# Patient Record
Sex: Male | Born: 1975 | Race: White | Hispanic: No | Marital: Single | Smoking: Current every day smoker
Health system: Southern US, Community
[De-identification: ages and names within clinical notes are randomized; demographics above are authoritative.]

## PROBLEM LIST (undated history)

## (undated) DIAGNOSIS — Z789 Other specified health status: Secondary | ICD-10-CM

---

## 2019-12-23 ENCOUNTER — Encounter (HOSPITAL_COMMUNITY)
Admission: EM | Disposition: A | Discharge status: Home / Self Care | Payer: Self-pay | Source: Home / Self Care | Attending: Orthopedic Surgery

## 2019-12-23 ENCOUNTER — Inpatient Hospital Stay (HOSPITAL_COMMUNITY)
Admission: EM | Admit: 2019-12-23 | Discharge: 2019-12-26 | DRG: 501 | Disposition: A | Discharge status: Home / Self Care | Payer: Self-pay | Attending: Orthopedic Surgery | Admitting: Orthopedic Surgery

## 2019-12-23 ENCOUNTER — Emergency Department (HOSPITAL_COMMUNITY): Payer: Self-pay

## 2019-12-23 ENCOUNTER — Encounter (HOSPITAL_COMMUNITY): Payer: Self-pay

## 2019-12-23 ENCOUNTER — Other Ambulatory Visit: Payer: Self-pay

## 2019-12-23 DIAGNOSIS — S52572A Other intraarticular fracture of lower end of left radius, initial encounter for closed fracture: Secondary | ICD-10-CM | POA: Diagnosis present

## 2019-12-23 DIAGNOSIS — S40012A Contusion of left shoulder, initial encounter: Secondary | ICD-10-CM | POA: Diagnosis present

## 2019-12-23 DIAGNOSIS — S52501A Unspecified fracture of the lower end of right radius, initial encounter for closed fracture: Secondary | ICD-10-CM

## 2019-12-23 DIAGNOSIS — S52571A Other intraarticular fracture of lower end of right radius, initial encounter for closed fracture: Principal | ICD-10-CM | POA: Diagnosis present

## 2019-12-23 DIAGNOSIS — F17200 Nicotine dependence, unspecified, uncomplicated: Secondary | ICD-10-CM | POA: Diagnosis present

## 2019-12-23 DIAGNOSIS — S52592A Other fractures of lower end of left radius, initial encounter for closed fracture: Secondary | ICD-10-CM | POA: Diagnosis present

## 2019-12-23 DIAGNOSIS — Y929 Unspecified place or not applicable: Secondary | ICD-10-CM

## 2019-12-23 DIAGNOSIS — R52 Pain, unspecified: Secondary | ICD-10-CM

## 2019-12-23 DIAGNOSIS — W11XXXA Fall on and from ladder, initial encounter: Secondary | ICD-10-CM | POA: Diagnosis present

## 2019-12-23 DIAGNOSIS — Z20822 Contact with and (suspected) exposure to covid-19: Secondary | ICD-10-CM | POA: Diagnosis present

## 2019-12-23 DIAGNOSIS — S52502A Unspecified fracture of the lower end of left radius, initial encounter for closed fracture: Secondary | ICD-10-CM

## 2019-12-23 HISTORY — PX: ORIF WRIST FRACTURE: SHX2133

## 2019-12-23 HISTORY — DX: Other specified health status: Z78.9

## 2019-12-23 LAB — SARS CORONAVIRUS 2 BY RT PCR (HOSPITAL ORDER, PERFORMED IN ~~LOC~~ HOSPITAL LAB): SARS Coronavirus 2: NEGATIVE

## 2019-12-23 SURGERY — OPEN REDUCTION INTERNAL FIXATION (ORIF) WRIST FRACTURE
Anesthesia: General | Site: Wrist | Laterality: Bilateral

## 2019-12-23 MED ORDER — HYDROCODONE-ACETAMINOPHEN 5-325 MG PO TABS
1.0000 | ORAL_TABLET | Freq: Once | ORAL | Status: AC
  Administered 2019-12-23: 1 via ORAL
  Filled 2019-12-23: qty 1

## 2019-12-23 MED ORDER — ONDANSETRON HCL 4 MG/2ML IJ SOLN: 4.0000 mg | Freq: Four times a day (QID) | INTRAMUSCULAR | Status: DC | PRN

## 2019-12-23 MED ORDER — PROMETHAZINE HCL 12.5 MG RE SUPP: 12.5000 mg | Freq: Four times a day (QID) | RECTAL | Status: DC | PRN

## 2019-12-23 MED ORDER — SUCCINYLCHOLINE CHLORIDE 200 MG/10ML IV SOSY
PREFILLED_SYRINGE | INTRAVENOUS | Status: AC
  Filled 2019-12-23: qty 10

## 2019-12-23 MED ORDER — POTASSIUM CHLORIDE 2 MEQ/ML IV SOLN
INTRAVENOUS | Status: DC
  Filled 2019-12-23 (×2): qty 1000

## 2019-12-23 MED ORDER — ONDANSETRON HCL 4 MG PO TABS: 4.0000 mg | ORAL_TABLET | Freq: Four times a day (QID) | ORAL | Status: DC | PRN

## 2019-12-23 MED ORDER — DEXAMETHASONE SODIUM PHOSPHATE 10 MG/ML IJ SOLN
INTRAMUSCULAR | Status: AC
  Filled 2019-12-23: qty 1

## 2019-12-23 MED ORDER — ACETAMINOPHEN 325 MG PO TABS
325.0000 mg | ORAL_TABLET | Freq: Four times a day (QID) | ORAL | Status: DC | PRN
  Administered 2019-12-25: 650 mg via ORAL
  Filled 2019-12-23: qty 2

## 2019-12-23 MED ORDER — MIDAZOLAM HCL 5 MG/5ML IJ SOLN
INTRAMUSCULAR | Status: DC | PRN
  Administered 2019-12-23: 2 mg via INTRAVENOUS

## 2019-12-23 MED ORDER — CHLORHEXIDINE GLUCONATE 0.12 % MT SOLN: 15.0000 mL | Freq: Once | OROMUCOSAL | Status: AC

## 2019-12-23 MED ORDER — MIDAZOLAM HCL 2 MG/2ML IJ SOLN
INTRAMUSCULAR | Status: AC
  Filled 2019-12-23: qty 2

## 2019-12-23 MED ORDER — HYDROMORPHONE HCL 1 MG/ML IJ SOLN
0.2500 mg | INTRAMUSCULAR | Status: DC | PRN
  Administered 2019-12-23 (×2): 0.5 mg via INTRAVENOUS

## 2019-12-23 MED ORDER — HYDROMORPHONE HCL 1 MG/ML IJ SOLN: 1.0000 mg | INTRAMUSCULAR | Status: DC | PRN

## 2019-12-23 MED ORDER — CHLORHEXIDINE GLUCONATE 0.12 % MT SOLN
OROMUCOSAL | Status: AC
  Administered 2019-12-23: 15 mL via OROMUCOSAL
  Filled 2019-12-23: qty 15

## 2019-12-23 MED ORDER — 0.9 % SODIUM CHLORIDE (POUR BTL) OPTIME
TOPICAL | Status: DC | PRN
  Administered 2019-12-23: 1000 mL

## 2019-12-23 MED ORDER — LACTATED RINGERS IV SOLN: INTRAVENOUS | Status: DC | PRN

## 2019-12-23 MED ORDER — HYDROMORPHONE HCL 1 MG/ML IJ SOLN
INTRAMUSCULAR | Status: DC | PRN
  Administered 2019-12-23: .5 mg via INTRAVENOUS

## 2019-12-23 MED ORDER — HYDROMORPHONE HCL 1 MG/ML IJ SOLN
1.0000 mg | Freq: Once | INTRAMUSCULAR | Status: AC
  Administered 2019-12-23: 1 mg via INTRAVENOUS
  Filled 2019-12-23: qty 1

## 2019-12-23 MED ORDER — LIDOCAINE HCL (CARDIAC) PF 100 MG/5ML IV SOSY
PREFILLED_SYRINGE | INTRAVENOUS | Status: DC | PRN
  Administered 2019-12-23: 60 mg via INTRAVENOUS

## 2019-12-23 MED ORDER — ROCURONIUM BROMIDE 10 MG/ML (PF) SYRINGE
PREFILLED_SYRINGE | INTRAVENOUS | Status: AC
  Filled 2019-12-23: qty 10

## 2019-12-23 MED ORDER — LIDOCAINE 2% (20 MG/ML) 5 ML SYRINGE
INTRAMUSCULAR | Status: AC
  Filled 2019-12-23: qty 5

## 2019-12-23 MED ORDER — ONDANSETRON HCL 4 MG/2ML IJ SOLN
INTRAMUSCULAR | Status: AC
  Filled 2019-12-23: qty 2

## 2019-12-23 MED ORDER — SUGAMMADEX SODIUM 200 MG/2ML IV SOLN
INTRAVENOUS | Status: DC | PRN
  Administered 2019-12-23: 200 mg via INTRAVENOUS

## 2019-12-23 MED ORDER — ROCURONIUM BROMIDE 100 MG/10ML IV SOLN
INTRAVENOUS | Status: DC | PRN
  Administered 2019-12-23: 60 mg via INTRAVENOUS

## 2019-12-23 MED ORDER — FAMOTIDINE 20 MG PO TABS: 20.0000 mg | ORAL_TABLET | Freq: Two times a day (BID) | ORAL | Status: DC | PRN

## 2019-12-23 MED ORDER — OXYCODONE HCL 5 MG/5ML PO SOLN: 5.0000 mg | Freq: Once | ORAL | Status: DC | PRN

## 2019-12-23 MED ORDER — OXYCODONE HCL 5 MG PO TABS
10.0000 mg | ORAL_TABLET | ORAL | Status: DC | PRN
  Administered 2019-12-24 (×3): 10 mg via ORAL
  Administered 2019-12-25: 15 mg via ORAL
  Administered 2019-12-25 (×4): 10 mg via ORAL
  Administered 2019-12-26 (×2): 15 mg via ORAL
  Filled 2019-12-23 (×2): qty 3
  Filled 2019-12-23: qty 2
  Filled 2019-12-23: qty 3

## 2019-12-23 MED ORDER — CEFAZOLIN SODIUM-DEXTROSE 1-4 GM/50ML-% IV SOLN
1.0000 g | INTRAVENOUS | Status: AC
  Administered 2019-12-23: 1 g via INTRAVENOUS
  Filled 2019-12-23: qty 50

## 2019-12-23 MED ORDER — DEXAMETHASONE SODIUM PHOSPHATE 4 MG/ML IJ SOLN
INTRAMUSCULAR | Status: DC | PRN
  Administered 2019-12-23: 4 mg via INTRAVENOUS

## 2019-12-23 MED ORDER — DIPHENHYDRAMINE HCL 50 MG/ML IJ SOLN
INTRAMUSCULAR | Status: DC | PRN
  Administered 2019-12-23: 12.5 mg via INTRAVENOUS

## 2019-12-23 MED ORDER — HYDROMORPHONE HCL 1 MG/ML IJ SOLN
INTRAMUSCULAR | Status: AC
  Filled 2019-12-23: qty 1

## 2019-12-23 MED ORDER — OXYCODONE HCL 5 MG PO TABS
5.0000 mg | ORAL_TABLET | ORAL | Status: DC | PRN
  Filled 2019-12-23 (×6): qty 2

## 2019-12-23 MED ORDER — ACETAMINOPHEN 500 MG PO TABS
1000.0000 mg | ORAL_TABLET | Freq: Four times a day (QID) | ORAL | Status: AC
  Administered 2019-12-23 – 2019-12-24 (×3): 1000 mg via ORAL
  Filled 2019-12-23 (×4): qty 2

## 2019-12-23 MED ORDER — DOCUSATE SODIUM 100 MG PO CAPS
100.0000 mg | ORAL_CAPSULE | Freq: Two times a day (BID) | ORAL | Status: DC
  Administered 2019-12-24 – 2019-12-26 (×4): 100 mg via ORAL
  Filled 2019-12-23 (×5): qty 1

## 2019-12-23 MED ORDER — CEFAZOLIN SODIUM-DEXTROSE 2-4 GM/100ML-% IV SOLN
INTRAVENOUS | Status: AC
  Filled 2019-12-23: qty 100

## 2019-12-23 MED ORDER — BUPIVACAINE HCL (PF) 0.25 % IJ SOLN
INTRAMUSCULAR | Status: AC
  Filled 2019-12-23: qty 30

## 2019-12-23 MED ORDER — BUPIVACAINE HCL (PF) 0.25 % IJ SOLN
INTRAMUSCULAR | Status: DC | PRN
  Administered 2019-12-23: 8 mL

## 2019-12-23 MED ORDER — ONDANSETRON HCL 4 MG/2ML IJ SOLN
INTRAMUSCULAR | Status: DC | PRN
  Administered 2019-12-23: 4 mg via INTRAVENOUS

## 2019-12-23 MED ORDER — ASCORBIC ACID 500 MG PO TABS
1000.0000 mg | ORAL_TABLET | Freq: Every day | ORAL | Status: DC
  Administered 2019-12-24 – 2019-12-26 (×3): 1000 mg via ORAL
  Filled 2019-12-23 (×3): qty 2

## 2019-12-23 MED ORDER — SODIUM CHLORIDE 0.9 % IV BOLUS
500.0000 mL | Freq: Once | INTRAVENOUS | Status: AC
  Administered 2019-12-23: 500 mL via INTRAVENOUS

## 2019-12-23 MED ORDER — OXYCODONE HCL 5 MG PO TABS: 5.0000 mg | ORAL_TABLET | Freq: Once | ORAL | Status: DC | PRN

## 2019-12-23 MED ORDER — HYDROMORPHONE HCL 1 MG/ML IJ SOLN
INTRAMUSCULAR | Status: AC
  Filled 2019-12-23: qty 0.5

## 2019-12-23 MED ORDER — ONDANSETRON HCL 4 MG/2ML IJ SOLN
4.0000 mg | Freq: Once | INTRAMUSCULAR | Status: AC
  Administered 2019-12-23: 4 mg via INTRAVENOUS
  Filled 2019-12-23: qty 2

## 2019-12-23 MED ORDER — PROPOFOL 10 MG/ML IV BOLUS
INTRAVENOUS | Status: DC | PRN
  Administered 2019-12-23: 200 mg via INTRAVENOUS

## 2019-12-23 MED ORDER — LACTATED RINGERS IV SOLN: INTRAVENOUS | Status: DC

## 2019-12-23 MED ORDER — METHOCARBAMOL 1000 MG/10ML IJ SOLN
500.0000 mg | Freq: Four times a day (QID) | INTRAVENOUS | Status: DC | PRN
  Filled 2019-12-23: qty 5

## 2019-12-23 MED ORDER — ALPRAZOLAM 0.5 MG PO TABS: 0.5000 mg | ORAL_TABLET | Freq: Four times a day (QID) | ORAL | Status: DC | PRN

## 2019-12-23 MED ORDER — CHLORHEXIDINE GLUCONATE 4 % EX LIQD: 60.0000 mL | Freq: Once | CUTANEOUS | Status: DC

## 2019-12-23 MED ORDER — CEFAZOLIN SODIUM-DEXTROSE 2-4 GM/100ML-% IV SOLN: 2.0000 g | INTRAVENOUS | Status: DC

## 2019-12-23 MED ORDER — MORPHINE SULFATE (PF) 4 MG/ML IV SOLN
4.0000 mg | INTRAVENOUS | Status: DC | PRN
  Administered 2019-12-23: 4 mg via INTRAVENOUS
  Filled 2019-12-23: qty 1

## 2019-12-23 MED ORDER — CEFAZOLIN SODIUM-DEXTROSE 1-4 GM/50ML-% IV SOLN
1.0000 g | Freq: Three times a day (TID) | INTRAVENOUS | Status: DC
  Administered 2019-12-24 – 2019-12-26 (×7): 1 g via INTRAVENOUS
  Filled 2019-12-23 (×7): qty 50

## 2019-12-23 MED ORDER — FENTANYL CITRATE (PF) 100 MCG/2ML IJ SOLN
INTRAMUSCULAR | Status: DC | PRN
Start: 2019-12-23 — End: 2019-12-23
  Administered 2019-12-23: 50 ug via INTRAVENOUS
  Administered 2019-12-23: 100 ug via INTRAVENOUS
  Administered 2019-12-23 (×2): 50 ug via INTRAVENOUS

## 2019-12-23 MED ORDER — PROPOFOL 10 MG/ML IV BOLUS
INTRAVENOUS | Status: AC
  Filled 2019-12-23: qty 40

## 2019-12-23 MED ORDER — METHOCARBAMOL 500 MG PO TABS
500.0000 mg | ORAL_TABLET | Freq: Four times a day (QID) | ORAL | Status: DC | PRN
  Administered 2019-12-24 – 2019-12-26 (×5): 500 mg via ORAL
  Filled 2019-12-23 (×5): qty 1

## 2019-12-23 MED ORDER — FENTANYL CITRATE (PF) 250 MCG/5ML IJ SOLN
INTRAMUSCULAR | Status: AC
Start: 2019-12-23 — End: ?
  Filled 2019-12-23: qty 5

## 2019-12-23 MED ORDER — HYDROMORPHONE HCL 1 MG/ML IJ SOLN
0.5000 mg | INTRAMUSCULAR | Status: DC | PRN
  Administered 2019-12-24 – 2019-12-26 (×7): 1 mg via INTRAVENOUS
  Administered 2019-12-26: 0.5 mg via INTRAVENOUS
  Filled 2019-12-23: qty 0.5
  Filled 2019-12-23 (×7): qty 1

## 2019-12-23 SURGICAL SUPPLY — 72 items
BIT DRILL 2.2 SS TIBIAL (BIT) ×3 IMPLANT
BLADE CLIPPER SURG (BLADE) ×6 IMPLANT
BNDG ELASTIC 3X5.8 VLCR STR LF (GAUZE/BANDAGES/DRESSINGS) ×6 IMPLANT
BNDG ELASTIC 4X5.8 VLCR STR LF (GAUZE/BANDAGES/DRESSINGS) ×6 IMPLANT
BNDG ESMARK 4X9 LF (GAUZE/BANDAGES/DRESSINGS) ×3 IMPLANT
BNDG GAUZE ELAST 4 BULKY (GAUZE/BANDAGES/DRESSINGS) ×6 IMPLANT
CANISTER SUCT 3000ML PPV (MISCELLANEOUS) ×3 IMPLANT
CORD BIPOLAR FORCEPS 12FT (ELECTRODE) ×3 IMPLANT
COVER SURGICAL LIGHT HANDLE (MISCELLANEOUS) ×3 IMPLANT
COVER WAND RF STERILE (DRAPES) ×6 IMPLANT
CUFF TOURN SGL QUICK 18X4 (TOURNIQUET CUFF) ×6 IMPLANT
CUFF TOURN SGL QUICK 24 (TOURNIQUET CUFF)
CUFF TRNQT CYL 24X4X16.5-23 (TOURNIQUET CUFF) IMPLANT
DRAIN TLS ROUND 10FR (DRAIN) IMPLANT
DRAPE EXTREMITY T 121X128X90 (DISPOSABLE) ×3 IMPLANT
DRAPE OEC MINIVIEW 54X84 (DRAPES) ×3 IMPLANT
DRAPE SURG 17X23 STRL (DRAPES) ×6 IMPLANT
DRSG ADAPTIC 3X8 NADH LF (GAUZE/BANDAGES/DRESSINGS) ×6 IMPLANT
GAUZE 4X4 16PLY RFD (DISPOSABLE) ×3 IMPLANT
GAUZE SPONGE 4X4 12PLY STRL (GAUZE/BANDAGES/DRESSINGS) ×9 IMPLANT
GAUZE XEROFORM 1X8 LF (GAUZE/BANDAGES/DRESSINGS) ×6 IMPLANT
GLOVE SS BIOGEL STRL SZ 8 (GLOVE) ×2 IMPLANT
GLOVE SUPERSENSE BIOGEL SZ 8 (GLOVE) ×4
GOWN STRL REUS W/ TWL LRG LVL3 (GOWN DISPOSABLE) ×1 IMPLANT
GOWN STRL REUS W/ TWL XL LVL3 (GOWN DISPOSABLE) ×2 IMPLANT
GOWN STRL REUS W/TWL LRG LVL3 (GOWN DISPOSABLE) ×2
GOWN STRL REUS W/TWL XL LVL3 (GOWN DISPOSABLE) ×4
KIT BASIN OR (CUSTOM PROCEDURE TRAY) ×3 IMPLANT
KIT TURNOVER KIT B (KITS) ×3 IMPLANT
LOOP VESSEL MAXI BLUE (MISCELLANEOUS) IMPLANT
MANIFOLD NEPTUNE II (INSTRUMENTS) IMPLANT
NEEDLE 22X1 1/2 (OR ONLY) (NEEDLE) ×3 IMPLANT
NS IRRIG 1000ML POUR BTL (IV SOLUTION) ×3 IMPLANT
PACK ORTHO EXTREMITY (CUSTOM PROCEDURE TRAY) ×3 IMPLANT
PAD ARMBOARD 7.5X6 YLW CONV (MISCELLANEOUS) ×6 IMPLANT
PAD CAST 3X4 CTTN HI CHSV (CAST SUPPLIES) ×2 IMPLANT
PAD CAST 4YDX4 CTTN HI CHSV (CAST SUPPLIES) ×2 IMPLANT
PADDING CAST COTTON 3X4 STRL (CAST SUPPLIES) ×4
PADDING CAST COTTON 4X4 STRL (CAST SUPPLIES) ×4
PEG LOCKING SMOOTH 2.2X20 (Screw) ×18 IMPLANT
PEG LOCKING SMOOTH 2.2X22 (Screw) ×3 IMPLANT
PEG LOCKING SMOOTH 2.2X24 (Peg) ×12 IMPLANT
PILLOW ARM CARTER ADULT (MISCELLANEOUS) ×6 IMPLANT
PLATE MEDIUM DVR RIGHT (Plate) ×3 IMPLANT
PLATE STD DVR LEFT (Plate) ×3 IMPLANT
PLATE STD DVR LT 24X55 (Plate) ×1 IMPLANT
PUTTY DBM STAGRAFT PLUS 5CC (Putty) ×3 IMPLANT
SCREW LOCK 16X2.7X 3 LD TPR (Screw) ×4 IMPLANT
SCREW LOCK 18X2.7X 3 LD TPR (Screw) ×7 IMPLANT
SCREW LOCK 20X2.7X 3 LD TPR (Screw) ×1 IMPLANT
SCREW LOCK 22X2.7X 3 LD TPR (Screw) ×1 IMPLANT
SCREW LOCKING 2.7X16 (Screw) ×8 IMPLANT
SCREW LOCKING 2.7X18 (Screw) ×14 IMPLANT
SCREW LOCKING 2.7X20MM (Screw) ×2 IMPLANT
SCREW LOCKING 2.7X22MM (Screw) ×2 IMPLANT
SOL PREP POV-IOD 4OZ 10% (MISCELLANEOUS) ×6 IMPLANT
SPLINT FIBERGLASS 4X30 (CAST SUPPLIES) ×6 IMPLANT
SPONGE LAP 4X18 RFD (DISPOSABLE) IMPLANT
STOCKINETTE 6  STRL (DRAPES) ×2
STOCKINETTE 6 STRL (DRAPES) ×1 IMPLANT
SUT MNCRL AB 4-0 PS2 18 (SUTURE) IMPLANT
SUT PROLENE 3 0 PS 2 (SUTURE) ×12 IMPLANT
SUT VIC AB 3-0 FS2 27 (SUTURE) ×6 IMPLANT
SYR CONTROL 10ML LL (SYRINGE) ×3 IMPLANT
SYSTEM CHEST DRAIN TLS 7FR (DRAIN) IMPLANT
TOWEL GREEN STERILE (TOWEL DISPOSABLE) ×3 IMPLANT
TOWEL GREEN STERILE FF (TOWEL DISPOSABLE) ×6 IMPLANT
TUBE CONNECTING 12'X1/4 (SUCTIONS) ×2
TUBE CONNECTING 12X1/4 (SUCTIONS) ×4 IMPLANT
TUBE EVACUATION TLS (MISCELLANEOUS) IMPLANT
UNDERPAD 30X36 HEAVY ABSORB (UNDERPADS AND DIAPERS) ×6 IMPLANT
WATER STERILE IRR 1000ML POUR (IV SOLUTION) ×3 IMPLANT

## 2019-12-23 NOTE — ED Notes (Signed)
Pt's friend, Irving Burton (# in chart) wants update after surgery

## 2019-12-23 NOTE — Op Note (Signed)
Operative note December 23, 2019  Dominica Severin MD  Preoperative diagnosis comminuted complex and articular right and left distal radius fractures after fall from a ladder.  Postop diagnosis: The same  Operative procedure #1 open reduction internal fixation comminuted complex left distal radius fracture with DVR volar rim plate #2 separate dorsal approach right wrist with bone grafting and posterior interosseous nerve neurectomy #3 EPL decompression and anterior transposition left wrist #4 5 view x-ray series left wrist #5 open reduction internal fixation right comminuted complex wrist fracture right upper extremity with extended DVR standard plate #6 5 view radiographic series right wrist  Surgeon Dominica Severin  Assistant none  Anesthesia General  Estimated blood loss minimal  Tourniquet time less than an hour for each side  Description of procedure in detail.  Patient was taken to the operative theater and underwent a smooth induction of general anesthetic at this time I performed a prep and drape of the left upper extremity.  Hibiclens scrub x2 followed by 10-minute surgical Betadine scrub and paint was accomplished.  Outlined marks were made with marking pen and sterile field secured and timeout observed.  Preoperative antibiotics were given.  Following this tourniquet was insufflated and had a volar radial incision was made about the left wrist dissection was carried down FCR was incised palmarly and dorsally carpal canal contents were swept ulnarly fracture was accessed and following this the fracture underwent reduction and placement of an extended volar rim plate.  There was a marked amount of comminution noted.  The patient also had a bone shard distal in location.  I very carefully and cautiously applied the plate and screw construct.  This was a volar rim plate the construct went in without difficulty and there were no complicating features.  Once this was complete I then  turned the wrist over and evaluated the dorsal aspect.  A 1-1/2 to 2 inch incision was made dissection was carried down and the patient underwent EPL decompression and anterior transposition as there was a bone shard against it.  I then packed the area with autologous bone graft in the form of stay graft.  Following this I then identified a dorsally displaced cortical fragment and make sure it was out of the way of the wrist planes of motion and out of the way of the tendon apparatus.  The patient tolerated this well.  A posterior interosseous nerve neurectomy was accomplished.  Thus a ORIF volar rim plate was accomplished to the volar incision.  A dorsal incision was made for posterior interosseous nerve neurectomy and EPL as well as fourth dorsal compartment decompression and bone grafting.  5 view x-ray series looked excellent and I was pleased with this.  Following this the pronator was closed with Vicryl skin edge was closed with Prolene no drain was necessary as bleeding was minimal.  The dorsal wound was closed with Prolene as well.  Compartments were soft refill was excellent and there were no complicating features short arm splint was applied.  Radiocarpal midcarpal and distal radial ulnar joint mechanics looked quite well and I was pleased with this.  Following this we placed him in a standard dressing short arm splint and a Carter arm pillow for elevation.  Next, attention was turned towards the right upper extremity.  Sterile prep and drape was accomplished.  Hibiclens scrub x2 followed by 10-minute surgical Betadine scrub and paint was accomplished.  Once this was complete timeout was again called and right volar radial incision was made  dissection was carried down.  FCR was similarly incised followed by decompression of the volar forearm contents followed by carpal canal contents being swept ulnarly and access to the fracture accomplished.  Pronator was incised and application of an  extended/long DVR plate standard in nature was accomplished.  He had a split ulnarly which was sagittal and thus I applied a longer plate for stability purposes.  I was able to achieve radial height inclination and volar tilt to my satisfaction.  The patient was quite well and I was pleased with this.  Following this we then very carefully and cautiously performed a irrigation process followed by closure of the pronator and 5 view radiographic series demonstrated excellent position.  Thus ORIF of the distal radius was accomplished.  5 view radiographic series looked excellent.  Standard dressing was applied with Adaptic Xeroform volar splint and a Carter arm pillow for elevation.  The ORIF's were very different but both complex.  We will watch the dorsal piece of cortical bone and make sure things go smoothly.  Elevate move massage and keep the area clean and dry.  The patient understands the importance of edema control and other issues.  I spoke with him at length preoperatively.  We will rehabbing according to our standard protocol.  These notes of been discussed and all questions have been encouraged and answered.  He will be admitted overnight for IV antibiotics and general postop care and pain control.  Unfortunately this is a very severe injury and he will have a high predilection in the general population towards arthritis and other problems including stiffness in his wrist.  Marcene Laskowski MD

## 2019-12-23 NOTE — ED Notes (Signed)
Patient transported to X-ray 

## 2019-12-23 NOTE — H&P (Signed)
Please see H&P that has been cosigned.  Patient has bilateral comminuted complex distal radius fractures.  He has no lower extremity complaints.  Abdomen is soft nontender nondistended.  I performed a comprehensive review of the patient and his past medical and surgical history as well as meds and allergies.  He is a stable candidate for surgery.  Coronavirus testing is negative.  We will plan for open reduction internal fixation to the right and left upper extremity/distal radius fractures.  Patient presents for evaluation and treatment of the of their upper extremity predicament. The patient denies neck, back, chest or  abdominal pain. The patient notes that they have no lower extremity problems. The patients primary complaint is noted. We are planning surgical care pathway for the upper extremity.  We are planning surgery for your upper extremity. The risk and benefits of surgery to include risk of bleeding, infection, anesthesia,  damage to normal structures and failure of the surgery to accomplish its intended goals of relieving symptoms and restoring function have been discussed in detail. With this in mind we plan to proceed. I have specifically discussed with the patient the pre-and postoperative regime and the dos and don'ts and risk and benefits in great detail. Risk and benefits of surgery also include risk of dystrophy(CRPS), chronic nerve pain, failure of the healing process to go onto completion and other inherent risks of surgery The relavent the pathophysiology of the disease/injury process, as well as the alternatives for treatment and postoperative course of action has been discussed in great detail with the patient who desires to proceed.  We will do everything in our power to help you (the patient) restore function to the upper extremity. It is a pleasure to see this patient today.   The patient is alert and oriented in no acute distress. The patient complains of pain in the  affected upper extremity.  The patient is noted to have a normal HEENT exam. Lung fields show equal chest expansion and no shortness of breath. Abdomen exam is nontender without distention. Lower extremity examination does not show any fracture dislocation or blood clot symptoms. Pelvis is stable and the neck and back are stable and nontender.  Once again, ORIF bilateral right and left distal radius fractures.  Patient understands all issues risk and benefits.  Dalissa Lovin MD

## 2019-12-23 NOTE — Anesthesia Preprocedure Evaluation (Signed)
Anesthesia Evaluation  Patient identified by MRN, date of birth, ID band Patient awake    Reviewed: Allergy & Precautions, H&P , NPO status , Patient's Chart, lab work & pertinent test results  Airway Mallampati: II   Neck ROM: full    Dental   Pulmonary Current Smoker,    breath sounds clear to auscultation       Cardiovascular negative cardio ROS   Rhythm:regular Rate:Normal     Neuro/Psych    GI/Hepatic   Endo/Other    Renal/GU      Musculoskeletal   Abdominal   Peds  Hematology   Anesthesia Other Findings   Reproductive/Obstetrics                             Anesthesia Physical Anesthesia Plan  ASA: II  Anesthesia Plan: General   Post-op Pain Management:    Induction: Intravenous  PONV Risk Score and Plan: 1 and Ondansetron, Dexamethasone and Treatment may vary due to age or medical condition  Airway Management Planned: Oral ETT  Additional Equipment:   Intra-op Plan:   Post-operative Plan: Extubation in OR  Informed Consent: I have reviewed the patients History and Physical, chart, labs and discussed the procedure including the risks, benefits and alternatives for the proposed anesthesia with the patient or authorized representative who has indicated his/her understanding and acceptance.       Plan Discussed with: CRNA, Anesthesiologist and Surgeon  Anesthesia Plan Comments:         Anesthesia Quick Evaluation

## 2019-12-23 NOTE — ED Provider Notes (Signed)
[facility] EMERGENCY DEPARTMENT Provider Note   CSN: [id number] Arrival date & time: 12/23/19  1250     History Chief Complaint  Patient presents with  . Fall    Jonathan Dominguez is a 44 y.o. male.  HPI Patient presents for evaluation of bilateral wrist injuries, sustained in a fall from a ladder.  He was standing on top of a 6 foot stepladder, when the ladder went behind him and he fell prone onto the ground, catching himself towards, he did not develop head, neck or back pain, presents for evaluation, by private vehicle.  There is no loss of consciousness.  The injury occurred about 90 minutes ago.  There are no other known modifying factors.    Past Medical History:  Diagnosis Date  . Medical history non-contributory     There are no problems to display for this patient.   History reviewed. No pertinent surgical history.     History reviewed. No pertinent family history.  Social History   Tobacco Use  . Smoking status: Current Every Day Smoker  Substance Use Topics  . Alcohol use: Not on file  . Drug use: Not on file    Home Medications Prior to Admission medications   Not on File    Allergies    Patient has no allergy information on record.  Review of Systems   Review of Systems  All other systems reviewed and are negative.   Physical Exam Updated Vital Signs BP (!) 148/101   Pulse 70   Temp 98.4 F (36.9 C) (Oral)   Resp 17   Ht 5\' 8"  (1.727 m)   Wt 88.5 kg   SpO2 100%   BMI 29.65 kg/m   Physical Exam Vitals and nursing note reviewed.  Constitutional:      General: He is not in acute distress.    Appearance: He is well-developed. He is not ill-appearing, toxic-appearing or diaphoretic.  HENT:     Head: Normocephalic and atraumatic.     Right Ear: External ear normal.     Left Ear: External ear normal.     Mouth/Throat:     Mouth: Mucous membranes are moist.     Pharynx: No oropharyngeal exudate or posterior  oropharyngeal erythema.  Eyes:     Conjunctiva/sclera: Conjunctivae normal.     Pupils: Pupils are equal, round, and reactive to light.  Neck:     Trachea: Phonation normal.  Cardiovascular:     Rate and Rhythm: Normal rate and regular rhythm.     Heart sounds: Normal heart sounds.  Pulmonary:     Effort: Pulmonary effort is normal.     Breath sounds: Normal breath sounds.  Chest:     Chest wall: No tenderness.  Abdominal:     General: There is no distension.     Palpations: Abdomen is soft.     Tenderness: There is no abdominal tenderness.  Musculoskeletal:     Cervical back: Normal range of motion and neck supple.     Comments: No tenderness of the cervical, thoracic or lumbar spines.  Deformities bilateral wrists, consistent with distal radial fractures, left greater than right.  Neurovascular intact distally in the fingers of both hands, bilaterally.  Skin:    General: Skin is warm and dry.     Comments: No bruising of the head, or torso.  Neurological:     Mental Status: He is alert and oriented to person, place, and time.     Cranial Nerves:  No cranial nerve deficit.     Sensory: No sensory deficit.     Motor: No abnormal muscle tone.     Coordination: Coordination normal.  Psychiatric:        Mood and Affect: Mood normal.        Behavior: Behavior normal.        Thought Content: Thought content normal.        Judgment: Judgment normal.     ED Results / Procedures / Treatments   Labs (all labs ordered are listed, but only abnormal results are displayed) Labs Reviewed  SARS CORONAVIRUS 2 BY RT PCR (HOSPITAL ORDER, PERFORMED IN [facility])    EKG None  Radiology DG Wrist Complete Left  Result Date: 12/23/2019 CLINICAL DATA:  Pain.  Fell 8 feet off ladder.  Wrist deformity. EXAM: LEFT WRIST - COMPLETE 3+ VIEW COMPARISON:  None. FINDINGS: Acute, highly comminuted and impacted fracture of the distal radial metaphysis with intra-articular extension,  approximately 4 mm of dorsal displacement, and dorsal tilt. Dorsally displaced linear ossific fragment small multiple small ossific fragments along the dorsal aspect of the lunate. Surrounding soft tissue swelling. IMPRESSION: Acute, highly comminuted and impacted fracture of the distal radial metaphysis with intra-articular extension and 4 mm of dorsal displacement and dorsal tilt. Electronically Signed   By: Feliberto Harts MD   On: 12/23/2019 14:01   DG Wrist Complete Right  Result Date: 12/23/2019 CLINICAL DATA:  Larey Seat 8 feet off ladder.  Pain.  Wrist deformity. EXAM: RIGHT WRIST - COMPLETE 3+ VIEW COMPARISON:  None. FINDINGS: Acute, comminuted and impacted fracture of the distal radial metaphysis with intra-articular extension and mild dorsal and lateral displacement. Mild dorsal tilt. Linear, longitudional nondisplaced components along the more proximal radial metadiaphysis. Surrounding soft tissue swelling. IMPRESSION: Acute, comminuted and impacted fracture of the distal radial metaphysis with intra-articular extension and mild dorsal and lateral displacement. Electronically Signed   By: Feliberto Harts MD   On: 12/23/2019 14:02    Procedures Procedures (including critical care time)  Medications Ordered in ED Medications  morphine 4 MG/ML injection 4 mg (has no administration in time range)  HYDROmorphone (DILAUDID) injection 1 mg (has no administration in time range)  HYDROcodone-acetaminophen (NORCO/VICODIN) 5-325 MG per tablet 1 tablet (1 tablet Oral Given 12/23/19 1402)  ondansetron (ZOFRAN) injection 4 mg (4 mg Intravenous Given 12/23/19 1403)  sodium chloride 0.9 % bolus 500 mL (0 mLs Intravenous Stopped 12/23/19 1554)  HYDROmorphone (DILAUDID) injection 1 mg (1 mg Intravenous Given 12/23/19 1603)    ED Course  I have reviewed the triage vital signs and the nursing notes.  Pertinent labs & imaging results that were available during my care of the patient were reviewed by me and  considered in my medical decision making (see chart for details).  Clinical Course as of Dec 22 1609  Tue Dec 23, 2019  1607 Case discussed with orthopedics, PA covering hand call, who arranged for the hand surgeon to evaluate and treat patient.  He plans on taking the patient to the operative theater to repair of the bilateral wrist fractures.   [EW]    Clinical Course User Index [EW] Mancel Bale, MD   MDM Rules/Calculators/A&P                           Patient Vitals for the past 24 hrs:  BP Temp Temp src Pulse Resp SpO2 Height Weight  12/23/19 1330 -- -- -- --  17 -- -- --  12/23/19 1325 (!) 148/101 -- -- -- 17 -- -- --  12/23/19 1258 128/83 98.4 F (36.9 C) Oral 70 (!) 22 100 % 5\' 8"  (1.727 m) 88.5 kg    4:10 PM Reevaluation with update and discussion. After initial assessment and treatment, an updated evaluation reveals he required additional analgesia for recurrent pain.  He has been seen by orthopedics who plan on operative repair.  Findings discussed and questions answered.   Medical Decision Making:  This patient is presenting for evaluation of injuries from fall, ladder, feet about 6 feet off the ground, which does require a range of treatment options, and is a complaint that involves a moderate risk of morbidity and mortality. The differential diagnoses include fractures, visceral injury, spine injury. I decided to review old records, and in summary healthy middle-aged man presenting with isolated injuries to both wrists, from fall.  No clinical or historical concern for injuries to head, or spine.  Also doubt visceral, intrathoracic, or intra-abdominal injuries.  I did not require additional historical information from anyone.   Radiologic Tests Ordered, included bilateral wrist x-rays.  I independently Visualized: Radiographic images, which show impacted angulated bilateral distal radius fractures    Critical Interventions-clinical evaluation,  radiographic imaging, narcotic analgesia, observation reassessment  After These Interventions, the Patient was reevaluated and was found to require management by orthopedic hand surgery for manipulation of fractures bilateral distal radius  CRITICAL CARE-no Performed by: Mancel Bale  Nursing Notes Reviewed/ Care Coordinated Applicable Imaging Reviewed Interpretation of Laboratory Data incorporated into ED treatment  Plan disposition by orthopedic hand surgery following operative management.    Final Clinical Impression(s) / ED Diagnoses Final diagnoses:  Closed fracture of distal end of right radius, unspecified fracture morphology, initial encounter  Closed fracture of distal end of left radius, unspecified fracture morphology, initial encounter    Rx / DC Orders ED Discharge Orders    None       Mancel Bale, MD 12/23/19 1611

## 2019-12-23 NOTE — ED Notes (Signed)
Pain medication offered to the pt and he refuses more pain meds at this time.

## 2019-12-23 NOTE — OR Nursing (Signed)
Care of patient assumed at 65.

## 2019-12-23 NOTE — ED Triage Notes (Signed)
Pt was working when ladder slipped out from under him, about 8 feet high, Pt did hit his face but no LOC. Obvious deformity to bilateral wrists, closed. Pt denies any neck or back pain. Pt a.o

## 2019-12-23 NOTE — Transfer of Care (Signed)
Immediate Anesthesia Transfer of Care Note  Patient: Jonathan Dominguez  Procedure(s) Performed: OPEN REDUCTION INTERNAL FIXATION (ORIF) WRIST FRACTURE (Bilateral Wrist)  Patient Location: PACU  Anesthesia Type:General  Level of Consciousness: awake, alert , oriented and patient cooperative  Airway & Oxygen Therapy: Patient Spontanous Breathing  Post-op Assessment: Report given to RN and Post -op Vital signs reviewed and stable  Post vital signs: Reviewed and stable  Last Vitals:  Vitals Value Taken Time  BP 129/82 12/23/19 2111  Temp 37.3 C 12/23/19 2111  Pulse 78 12/23/19 2118  Resp 26 12/23/19 2118  SpO2 99 % 12/23/19 2118  Vitals shown include unvalidated device data.  Last Pain:  Vitals:   12/23/19 2111  TempSrc:   PainSc: 3          Complications: No complications documented.

## 2019-12-23 NOTE — Anesthesia Procedure Notes (Signed)
Procedure Name: Intubation Date/Time: 12/23/2019 6:42 PM Performed by: Oletta Lamas, CRNA Pre-anesthesia Checklist: Patient identified, Emergency Drugs available, Suction available and Patient being monitored Patient Re-evaluated:Patient Re-evaluated prior to induction Oxygen Delivery Method: Circle System Utilized Preoxygenation: Pre-oxygenation with 100% oxygen Induction Type: IV induction Ventilation: Mask ventilation without difficulty Laryngoscope Size: Mac and 4 Grade View: Grade I Tube type: Oral Tube size: 7.5 mm Number of attempts: 1 Airway Equipment and Method: Stylet Placement Confirmation: ETT inserted through vocal cords under direct vision,  positive ETCO2 and breath sounds checked- equal and bilateral Secured at: 22 cm Tube secured with: Tape Dental Injury: Teeth and Oropharynx as per pre-operative assessment

## 2019-12-23 NOTE — Consult Note (Signed)
Reason for Consult:Bilateral wrist fxs Referring Physician: E Jamarkus Dominguez is an 44 y.o. male.  HPI: Jonathan Dominguez was working on a stepladder when it went out from under him. He fell forwards and put out his arms to break his fall. He had immediate bilateral wrist pain. He was brought to the ED where x-rays showed bilateral distal radii fxs and hand surgery was consulted. He is RHD and works in [employer].  History reviewed. No pertinent past medical history.  History reviewed. No pertinent surgical history.  No family history on file.  Social History:  has no history on file for tobacco use, alcohol use, and drug use.  Allergies: Not on File  Medications: I have reviewed the patient's current medications.  No results found for this or any previous visit (from the past 48 hour(s)).  DG Wrist Complete Left  Result Date: 12/23/2019 CLINICAL DATA:  Pain.  Fell 8 feet off ladder.  Wrist deformity. EXAM: LEFT WRIST - COMPLETE 3+ VIEW COMPARISON:  None. FINDINGS: Acute, highly comminuted and impacted fracture of the distal radial metaphysis with intra-articular extension, approximately 4 mm of dorsal displacement, and dorsal tilt. Dorsally displaced linear ossific fragment small multiple small ossific fragments along the dorsal aspect of the lunate. Surrounding soft tissue swelling. IMPRESSION: Acute, highly comminuted and impacted fracture of the distal radial metaphysis with intra-articular extension and 4 mm of dorsal displacement and dorsal tilt. Electronically Signed   By: Jonathan Harts MD   On: 12/23/2019 14:01   DG Wrist Complete Right  Result Date: 12/23/2019 CLINICAL DATA:  Larey Seat 8 feet off ladder.  Pain.  Wrist deformity. EXAM: RIGHT WRIST - COMPLETE 3+ VIEW COMPARISON:  None. FINDINGS: Acute, comminuted and impacted fracture of the distal radial metaphysis with intra-articular extension and mild dorsal and lateral displacement. Mild dorsal tilt. Linear, longitudional  nondisplaced components along the more proximal radial metadiaphysis. Surrounding soft tissue swelling. IMPRESSION: Acute, comminuted and impacted fracture of the distal radial metaphysis with intra-articular extension and mild dorsal and lateral displacement. Electronically Signed   By: Jonathan Harts MD   On: 12/23/2019 14:02    Review of Systems  HENT: Negative for ear discharge, ear pain, hearing loss and tinnitus.   Eyes: Negative for photophobia and pain.  Respiratory: Negative for cough and shortness of breath.   Cardiovascular: Negative for chest pain.  Gastrointestinal: Negative for abdominal pain, nausea and vomiting.  Genitourinary: Negative for dysuria, flank pain, frequency and urgency.  Musculoskeletal: Positive for arthralgias (Bilateral wrists). Negative for back pain, myalgias and neck pain.  Neurological: Negative for dizziness and headaches.  Hematological: Does not bruise/bleed easily.  Psychiatric/Behavioral: The patient is not nervous/anxious.    Blood pressure (!) 148/101, pulse 70, temperature 98.4 F (36.9 C), temperature source Oral, resp. rate 17, height 5\' 8"  (1.727 m), weight 88.5 kg, SpO2 100 %. Physical Exam Constitutional:      General: He is not in acute distress.    Appearance: He is well-developed. He is not diaphoretic.  HENT:     Head: Normocephalic and atraumatic.  Eyes:     General: No scleral icterus.       Right eye: No discharge.        Left eye: No discharge.     Conjunctiva/sclera: Conjunctivae normal.  Cardiovascular:     Rate and Rhythm: Normal rate and regular rhythm.  Pulmonary:     Effort: Pulmonary effort is normal. No respiratory distress.  Musculoskeletal:     Cervical back: Normal  range of motion.     Comments: Bilateral shoulder, elbow, wrist, digits- no skin wounds, severe TTP wrists, deformity pronounced on left, no instability, no blocks to motion  Sens  Ax/R/M/U intact  Mot   Ax/ R/ PIN/ M/ AIN/ U intact  Rad 2+   Skin:    General: Skin is warm and dry.  Neurological:     Mental Status: He is alert.  Psychiatric:        Behavior: Behavior normal.     Assessment/Plan: Bilateral wrist fxs -- Plan ORIF this afternoon by Dr. Amanda Pea. Anticipate discharge after surgery. Tobacco use    Jonathan Caldron, PA-C Orthopedic Surgery [phone] 12/23/2019, 3:28 PM

## 2019-12-24 ENCOUNTER — Encounter (HOSPITAL_COMMUNITY): Payer: Self-pay | Admitting: Orthopedic Surgery

## 2019-12-24 NOTE — Evaluation (Signed)
Occupational Therapy Evaluation Patient Details Name: Jonathan Dominguez MRN: [id number] DOB: [birth year] Today's Date: 12/24/2019    History of Present Illness 44 y.o male presenting after a fall from a ladder. Pt is now s/p bil ORIF of distal radius fxs. No significant PMH on file.   Clinical Impression   PTA pt living alone and functioning at independent community level. Pt works full time in [employer]. At time of eval, pt is able to complete bed mobility with min A and sit <> stands with supervision. He mobilized well without concerns for falls. Session was focused on in depth education regarding ADL management with BUE restrictions. Reviewed bathing, dressing, toileting/hygiene, grooming, feeding, light cooking, and appropriate activity level given precautions. Pt was able to brush his teeth this date with modification cues from OT. Pt also practiced toileting and simulated both UB/LBD. Pt then completed functional mobility to nourishment room to attempt to open refrigerator door and microwave. Pt is not able to pull with UEs, and plans to tie a sheet around the fridge handle to pull open with his elbow. Discussed built up silverware, waterproof bags for arms in shower, electric tooth brush, cups with handles, and toilet aide/bidet as AE to improve independence. Recommend pt follow up with OP OT for continued progression with functional BADL and for continued DRF rehab once cleared by surgeon. Will continue to follow acutely.       Follow Up Recommendations  Outpatient OT;Supervision - Intermittent    Equipment Recommendations  None recommended by OT    Recommendations for Other Services       Precautions / Restrictions Precautions Precautions: Other (comment) Precaution Comments: bil wrists immobilized Required Braces or Orthoses: Splint/Cast Splint/Cast: bil wrists Restrictions Weight Bearing Restrictions: Yes RUE Weight Bearing: Non weight bearing LUE Weight Bearing: Non weight  bearing      Mobility Bed Mobility Overal bed mobility: Needs Assistance Bed Mobility: Supine to Sit;Sit to Supine     Supine to sit: Min assist Sit to supine: Supervision   General bed mobility comments: pt required min A to come into upright sitting from EOB due to lack of UE usage; able to get back into bed without assist.  Transfers Overall transfer level: Needs assistance Equipment used: None Transfers: Sit to/from Stand Sit to Stand: Supervision              Balance Overall balance assessment: No apparent balance deficits (not formally assessed)                                         ADL either performed or assessed with clinical judgement   ADL Overall ADL's : Needs assistance/impaired Eating/Feeding: Set up;With adaptive utensils;Sitting Eating/Feeding Details (indicate cue type and reason): introduced idea of built up silverware for better grasp and control of utensils Grooming: Set up;Standing Grooming Details (indicate cue type and reason): educated pt on setting up supplies with modifications to achieve independence. Had pt set supplies on counter instead of placing multiple objects in his hand at once. Upper Body Bathing: Minimal assistance;Sitting   Lower Body Bathing: Minimal assistance;Sit to/from stand;Sitting/lateral leans   Upper Body Dressing : Minimal assistance;Sitting   Lower Body Dressing: Minimal assistance;Sit to/from stand;Bed level Lower Body Dressing Details (indicate cue type and reason): problem solved various ways for optimum independence. Pt is most independent lying flat in bed and slowly pulling pants up over hips. Toilet  Transfer: Modified Independent;Ambulation Toilet Transfer Details (indicate cue type and reason): sits for urination Toileting- Clothing Manipulation and Hygiene: Minimal assistance;Cueing for compensatory techniques Toileting - Clothing Manipulation Details (indicate cue type and reason): educated  pt on toilet aide with wet wipes for easier clean up. Pt is able to grasp size of toilet aide that is not the shape of tongs. Tub/ Shower Transfer: Supervision/safety;Ambulation   Functional mobility during ADLs: Modified independent General ADL Comments: in depth education and practice completed in managing BADL with 2 impaired UEs     Vision Patient Visual Report: No change from baseline       Perception     Praxis      Pertinent Vitals/Pain       Hand Dominance     Extremity/Trunk Assessment Upper Extremity Assessment Upper Extremity Assessment: RUE deficits/detail;LUE deficits/detail RUE Deficits / Details: dominant UE, also less painful; splinted from mid digits to mid forearm. Able to perform light grasp for basic BADL. Shoulder AROM WFL RUE Coordination: decreased fine motor;decreased gross motor LUE Deficits / Details: more painful extremity; splinted from mid digits to mid forearm. Inconsistent grasp due to pain. Reports pain in shoulder that improved with AROM LUE Coordination: decreased fine motor;decreased gross motor   Lower Extremity Assessment Lower Extremity Assessment: Overall WFL for tasks assessed       Communication     Cognition                                           General Comments       Exercises General Exercises - Upper Extremity Shoulder Flexion: AROM;Both;5 reps;Seated Shoulder ABduction: AROM;5 reps;Both;Seated Shoulder Horizontal ABduction: AROM;Both;5 reps;Seated Shoulder Horizontal ADduction: AROM;Both;5 reps;Seated Other Exercises Other Exercises: Hands: mobilizing digits as much as possible to promote ROM and decr edema. Pt with increased pain on L vs R   Shoulder Instructions      Home Living                                          Prior Functioning/Environment                   OT Problem List: Decreased strength;Decreased knowledge of use of DME or AE;Decreased range of  motion;Decreased coordination;Decreased knowledge of precautions;Impaired UE functional use;Pain      OT Treatment/Interventions: Self-care/ADL training;Therapeutic exercise;Patient/family education;Balance training;Therapeutic activities;DME and/or AE instruction    OT Goals(Current goals can be found in the care plan section) Acute Rehab OT Goals Patient Stated Goal: return to independence OT Goal Formulation: With patient Time For Goal Achievement: 01/07/20 Potential to Achieve Goals: Good  OT Frequency: Min 2X/week   Barriers to D/C:            Co-evaluation              AM-PAC OT "6 Clicks" Daily Activity     Outcome Measure Help from another person eating meals?: A Little Help from another person taking care of personal grooming?: A Little Help from another person toileting, which includes using toliet, bedpan, or urinal?: A Little Help from another person bathing (including washing, rinsing, drying)?: A Little Help from another person to put on and taking off regular upper body clothing?: A Little Help from another person to put  on and taking off regular lower body clothing?: A Little 6 Click Score: 18   End of Session Nurse Communication: Mobility status;Precautions;Weight bearing status  Activity Tolerance: Patient tolerated treatment well Patient left: in bed;with call bell/phone within reach  OT Visit Diagnosis: Other abnormalities of gait and mobility (R26.89);Pain Pain - Right/Left:  (bil) Pain - part of body:  (wrists)                Time: 6433-2951 OT Time Calculation (min): 76 min Charges:  OT General Charges $OT Visit: 1 Visit OT Evaluation $OT Eval Low Complexity: 1 Low OT Treatments $Self Care/Home Management : 53-67 mins  Dalphine Handing, MSOT, OTR/L Acute Rehabilitation Services [facility] Office Number: [phone] Pager: [phone]  Dalphine Handing 12/24/2019, 2:03 PM

## 2019-12-24 NOTE — Plan of Care (Signed)
Pt arrived on floor stable, pain controlled. Pt states feeling in his fingers are somewhat decreased but he is able to wiggle his fingers and educated to do so often. Pt contacted family members to make aware of location and situation. Pt relaxing well, will continue to monitor.   Problem: Education: Goal: Knowledge of General Education information will improve Description: Including pain rating scale, medication(s)/side effects and non-pharmacologic comfort measures Outcome: Progressing   Problem: Activity: Goal: Risk for activity intolerance will decrease Outcome: Progressing   Problem: Pain Managment: Goal: General experience of comfort will improve Outcome: Progressing   Problem: Safety: Goal: Ability to remain free from injury will improve Outcome: Progressing   Problem: Skin Integrity: Goal: Risk for impaired skin integrity will decrease Outcome: Progressing

## 2019-12-24 NOTE — Progress Notes (Signed)
Occupational Therapy Treatment Patient Details Name: Jonathan Dominguez MRN: [id number] DOB: [birth year] Today's Date: 12/24/2019    History of present illness 44 y.o male presenting after a fall from a ladder. Pt is now s/p bil ORIF of distal radius fxs. No significant PMH on file.   OT comments  Pt seen for additional OT session for focus on independent self eating and implementing AE (pt has had to have meals fed to him). Issued pt red tubing for utensils to improve independence with grasp. Pt practiced self feeding with tubing and able to achieve with set up assist. Also worked on problem solving different ways to use cell phone independently. D/c recs remain appropriate, will continue to follow.    Follow Up Recommendations  Outpatient OT;Supervision - Intermittent    Equipment Recommendations  None recommended by OT    Recommendations for Other Services      Precautions / Restrictions Precautions Precautions: Other (comment) Precaution Comments: bil wrists immobilized Required Braces or Orthoses: Splint/Cast Splint/Cast: bil wrists Restrictions Weight Bearing Restrictions: Yes RUE Weight Bearing: Non weight bearing LUE Weight Bearing: Non weight bearing       Mobility Bed Mobility Overal bed mobility: Needs Assistance Bed Mobility: Supine to Sit;Sit to Supine     Supine to sit: Min assist Sit to supine: Supervision   General bed mobility comments: up in chair  Transfers Overall transfer level: Modified independent Equipment used: None Transfers: Sit to/from Stand Sit to Stand: Supervision              Balance Overall balance assessment: No apparent balance deficits (not formally assessed)                                         ADL either performed or assessed with clinical judgement   ADL Overall ADL's : Needs assistance/impaired Eating/Feeding: Set up;With adaptive utensils;Sitting Eating/Feeding Details (indicate cue type and  reason): pt practiced with red tubing over utensils to improve independence with self feeding Grooming: Set up;Standing Grooming Details (indicate cue type and reason): attempted with red tubing on toothbrush. Educated pt to try electric toothbruch to compensate for injuries. Upper Body Bathing: Minimal assistance;Sitting   Lower Body Bathing: Minimal assistance;Sit to/from stand;Sitting/lateral leans   Upper Body Dressing : Minimal assistance;Sitting   Lower Body Dressing: Minimal assistance;Sit to/from stand;Bed level Lower Body Dressing Details (indicate cue type and reason): problem solved various ways for optimum independence. Pt is most independent lying flat in bed and slowly pulling pants up over hips. Toilet Transfer: Modified Independent;Ambulation Toilet Transfer Details (indicate cue type and reason): sits for urination Toileting- Clothing Manipulation and Hygiene: Minimal assistance;Cueing for compensatory techniques Toileting - Clothing Manipulation Details (indicate cue type and reason): educated pt on toilet aide with wet wipes for easier clean up. Pt is able to grasp size of toilet aide that is not the shape of tongs. Tub/ Shower Transfer: Supervision/safety;Ambulation   Functional mobility during ADLs: Modified independent General ADL Comments: discussed using phone and adaptive strategies for this; pt was given soft touch call bell     Vision Patient Visual Report: No change from baseline     Perception     Praxis      Cognition Arousal/Alertness: Awake/alert Behavior During Therapy: WFL for tasks assessed/performed Overall Cognitive Status: Within Functional Limits for tasks assessed  Exercises General Exercises - Upper Extremity Shoulder Flexion: AROM;Both;5 reps;Seated Shoulder ABduction: AROM;5 reps;Both;Seated Shoulder Horizontal ABduction: AROM;Both;5 reps;Seated Shoulder Horizontal ADduction:  AROM;Both;5 reps;Seated Other Exercises Other Exercises: Hands: mobilizing digits as much as possible to promote ROM and decr edema. Pt with increased pain on L vs R   Shoulder Instructions       General Comments      Pertinent Vitals/ Pain       Pain Assessment: Faces Faces Pain Scale: Hurts little more Pain Location: L >RUE Pain Descriptors / Indicators: Aching;Sore Pain Intervention(s): Limited activity within patient's tolerance;Monitored during session;Premedicated before session  Home Living                                          Prior Functioning/Environment              Frequency  Min 2X/week        Progress Toward Goals  OT Goals(current goals can now be found in the care plan section)  Progress towards OT goals: Progressing toward goals  Acute Rehab OT Goals Patient Stated Goal: return to independence OT Goal Formulation: With patient Time For Goal Achievement: 01/07/20 Potential to Achieve Goals: Good ADL Goals Pt/caregiver will Perform Home Exercise Program: Increased ROM;Both right and left upper extremity (digits, elbow, and shoulder) Additional ADL Goal #1: Pt will recall and implement necessary AE for BADL/IADL routine to improve independence in home environemnt  Plan Discharge plan remains appropriate    Co-evaluation                 AM-PAC OT "6 Clicks" Daily Activity     Outcome Measure   Help from another person eating meals?: A Little Help from another person taking care of personal grooming?: A Little Help from another person toileting, which includes using toliet, bedpan, or urinal?: A Little Help from another person bathing (including washing, rinsing, drying)?: A Little Help from another person to put on and taking off regular upper body clothing?: A Little Help from another person to put on and taking off regular lower body clothing?: A Little 6 Click Score: 18    End of Session    OT Visit  Diagnosis: Other abnormalities of gait and mobility (R26.89);Pain Pain - Right/Left:  (bil) Pain - part of body:  (wrists)   Activity Tolerance Patient tolerated treatment well   Patient Left in bed;with call bell/phone within reach   Nurse Communication Mobility status;Precautions;Weight bearing status        Time: 6237-6283 OT Time Calculation (min): 10 min  Charges: OT General Charges $OT Visit: 1 Visit OT Evaluation $OT Eval Low Complexity: 1 Low OT Treatments $Self Care/Home Management : 8-22 mins  Dalphine Handing, MSOT, OTR/L Acute Rehabilitation Services [facility] Office Number: [phone] Pager: [phone]  Dalphine Handing 12/24/2019, 5:44 PM

## 2019-12-24 NOTE — Plan of Care (Signed)

## 2019-12-24 NOTE — Plan of Care (Signed)
  Problem: Health Behavior/Discharge Planning: Goal: Ability to manage health-related needs will improve Outcome: Progressing   Problem: Activity: Goal: Risk for activity intolerance will decrease Outcome: Progressing   Problem: Pain Managment: Goal: General experience of comfort will improve Outcome: Progressing   

## 2019-12-24 NOTE — Progress Notes (Signed)
Patient has been seen and examined. Patient has pain appropriate to his injury/process. Patient denies new complaints at this present time. I have discussed the care pathway with nursing staff. Patient is appropriate and alert.  We reviewed vital signs and intake output which are stable.  The upper extremity is neurovascularly intact. Refill is normal. There is no signs of compartment syndrome. There is no signs of dystrophy. There is normal sensation.  I have spent a  great deal of time discussing range of motion edema control and other techniques to decrease edema and promote flexion extension of the fingers. Patient understands the importance of elevation range of motion massage and other measures to lessen pain and prevent swelling.  We have also discussed immobilization to appropriate areas involved.  We have discussed with the patient shoulder range of motion to prevent adhesive capsulitis.  The remainder of the examination is normal today without complicating feature. The patient and myself have discussed all issues plans and concerns.  At present juncture I do feel he is looking well.  He is status post bilateral wrist fractures.  We will get a splint today working on range of motion edema control and have therapy see him for ambulation and other measures.  I want to make sure he is competent with bathroom and activity of daily living needs prior to discharge.  We will continue antibiotics and discharge him tomorrow on pain medicine.  I discussed with him all issues plans and concerns.  Overall he is stable he is sensate no compartment syndrome stable to ligamentous examination and no evidence of dystrophy or infection. [name] MDPatient ID: Jonathan Dominguez, male   DOB: 1976/01/21, 44 y.o.   MRN: [id number]

## 2019-12-24 NOTE — Progress Notes (Signed)
PT Cancellation Note  Patient Details Name: Jonathan Dominguez MRN: [id number] DOB: 1975-08-15   Cancelled Treatment:    Reason Eval/Treat Not Completed: PT screened, no needs identified, will sign off Pt received during OT session. He is performing transfers/ambulation in room independently. Will defer to OT for education on ADL's and exercises. Thank you for this consult.    Lillia Pauls, PT, DPT Acute Rehabilitation Services Pager [phone] Office [phone]    Norval Morton 12/24/2019, 12:52 PM

## 2019-12-25 ENCOUNTER — Encounter (HOSPITAL_COMMUNITY): Payer: Self-pay | Admitting: Orthopedic Surgery

## 2019-12-25 NOTE — Progress Notes (Signed)
Occupational Therapy Treatment Patient Details Name: Jonathan Dominguez MRN: [id number] DOB: [birth year] Today's Date: 12/25/2019    History of present illness 44 y.o male presenting after a fall from a ladder. Pt is now s/p bil ORIF of distal radius fxs. No significant PMH on file.   OT comments  Patient continues to make steady progress towards goals in skilled OT session. Patient's session encompassed further education with regard to self-feeding and usage of cell phone as well as edema massage and AAROM and PROM to all digits. Pt reports significant pain when attempting to self feed, and while the built up handles help, pt is currently unable to flex to bring hand to mouth to feed without increased pain. Pt educated on importance of attempting to move digits to aid in decreased swelling, which pt is attempting, but is limited by pain and swelling. Discharge remains appropriate at this time, will continue to follow acutely.    Follow Up Recommendations  Outpatient OT;Supervision - Intermittent    Equipment Recommendations  None recommended by OT    Recommendations for Other Services      Precautions / Restrictions Precautions Precautions: Other (comment) Precaution Comments: bil wrists immobilized Required Braces or Orthoses: Splint/Cast Splint/Cast: bil wrists Restrictions Weight Bearing Restrictions: Yes RUE Weight Bearing: Non weight bearing LUE Weight Bearing: Non weight bearing       Mobility Bed Mobility                  Transfers                      Balance                                           ADL either performed or assessed with clinical judgement   ADL Overall ADL's : Needs assistance/impaired                                     Functional mobility during ADLs: Modified independent General ADL Comments: discussed using phone and adaptive strategies for eating as pt is still having difficulty despite built  up handles in session     Vision       Perception     Praxis      Cognition Arousal/Alertness: Awake/alert Behavior During Therapy: WFL for tasks assessed/performed Overall Cognitive Status: Within Functional Limits for tasks assessed                                          Exercises General Exercises - Upper Extremity Digit Composite Flexion: AAROM;Both;15 reps Composite Extension: AAROM;Both;15 reps Other Exercises Other Exercises: Edema massage to all digits Other Exercises: Circumduction to all digits PROM (counter and clockwise) x10 per digit Other Exercises: Thumb flexion and extension and circumduction x10 bilaterally   Shoulder Instructions       General Comments      Pertinent Vitals/ Pain       Pain Assessment: Faces Faces Pain Scale: Hurts little more Pain Location: L >RUE Pain Descriptors / Indicators: Aching;Sore Pain Intervention(s): Limited activity within patient's tolerance;Monitored during session;Premedicated before session;Repositioned  Home Living  Prior Functioning/Environment              Frequency  Min 2X/week        Progress Toward Goals  OT Goals(current goals can now be found in the care plan section)  Progress towards OT goals: Progressing toward goals  Acute Rehab OT Goals Patient Stated Goal: return to independence OT Goal Formulation: With patient Time For Goal Achievement: 01/07/20 Potential to Achieve Goals: Good  Plan Discharge plan remains appropriate    Co-evaluation                 AM-PAC OT "6 Clicks" Daily Activity     Outcome Measure   Help from another person eating meals?: A Little Help from another person taking care of personal grooming?: A Little Help from another person toileting, which includes using toliet, bedpan, or urinal?: A Little Help from another person bathing (including washing, rinsing, drying)?: A  Little Help from another person to put on and taking off regular upper body clothing?: A Little Help from another person to put on and taking off regular lower body clothing?: A Little 6 Click Score: 18    End of Session    OT Visit Diagnosis: Other abnormalities of gait and mobility (R26.89);Pain   Activity Tolerance Patient tolerated treatment well   Patient Left in bed;with call bell/phone within reach   Nurse Communication Mobility status;Precautions;Weight bearing status        Time: 8469-6295 OT Time Calculation (min): 26 min  Charges: OT General Charges $OT Visit: 1 Visit OT Treatments $Therapeutic Activity: 23-37 mins  Pollyann Glen E. Tenlee Wollin, COTA/L Acute Rehabilitation Services [phone]  Jonathan Dominguez 12/25/2019, 2:26 PM

## 2019-12-25 NOTE — Progress Notes (Signed)
Patient ID: Jonathan Dominguez, male   DOB: 01/10/1976, 44 y.o.   MRN: [id number] Patient is here for evaluation.  He is doing relatively well.  We discussed the exam.  He is intact to sensation and motor function.  I once again stressed the importance of elevation range of motion and other measures.  We discussed these issues in detail.  At present juncture we will plan to transition to home once his living situation is solidified.  He will be making calls and plans today as he does need help at home.  This was discussed with his therapist.  Today hopefully he can gain additional understanding and capabilities with his recovery process and we can transition him to home either later today or tomorrow morning.  Lower extremity examination is benign.  He is tolerating his diet.  He is voiding well.  No abdominal pain neck back chest or foot pain.  Overall he looks quite stable.  [name] MD cell phone [phone]

## 2019-12-25 NOTE — Anesthesia Postprocedure Evaluation (Signed)
Anesthesia Post Note  Patient: Jonathan Dominguez  Procedure(s) Performed: OPEN REDUCTION INTERNAL FIXATION (ORIF) WRIST FRACTURE (Bilateral Wrist)     Patient location during evaluation: PACU Anesthesia Type: General Level of consciousness: awake and alert Pain management: pain level controlled Vital Signs Assessment: post-procedure vital signs reviewed and stable Respiratory status: spontaneous breathing, nonlabored ventilation, respiratory function stable and patient connected to nasal cannula oxygen Cardiovascular status: blood pressure returned to baseline and stable Postop Assessment: no apparent nausea or vomiting Anesthetic complications: no   No complications documented.  Last Vitals:  Vitals:   12/24/19 1927 12/25/19 0349  BP: 136/68 (!) 141/71  Pulse: 70 64  Resp: 19 17  Temp: 36.7 C 37.1 C  SpO2: 98% 94%    Last Pain:  Vitals:   12/25/19 0408  TempSrc:   PainSc: Asleep                 Brayden Betters S

## 2019-12-25 NOTE — Plan of Care (Signed)
?  Problem: Health Behavior/Discharge Planning: ?Goal: Ability to manage health-related needs will improve ?Outcome: Not Progressing ?  ?Problem: Pain Managment: ?Goal: General experience of comfort will improve ?Outcome: Not Progressing ?  ?

## 2019-12-26 ENCOUNTER — Inpatient Hospital Stay (HOSPITAL_COMMUNITY): Payer: Self-pay

## 2019-12-26 MED ORDER — ONDANSETRON HCL 4 MG PO TABS: 4.0000 mg | ORAL_TABLET | Freq: Every day | ORAL | 1 refills | Status: AC | PRN

## 2019-12-26 MED ORDER — METHOCARBAMOL 500 MG PO TABS: 500.0000 mg | ORAL_TABLET | Freq: Four times a day (QID) | ORAL | 0 refills | Status: AC

## 2019-12-26 MED ORDER — HYDROMORPHONE HCL 2 MG PO TABS: 2.0000 mg | ORAL_TABLET | ORAL | 0 refills | Status: AC | PRN

## 2019-12-26 NOTE — Plan of Care (Signed)

## 2019-12-26 NOTE — Discharge Instructions (Signed)

## 2019-12-26 NOTE — Discharge Summary (Signed)
Physician Discharge Summary  Patient ID: Jonathan Dominguez MRN: [id number] DOB/AGE: [birth year] 44 y.o.  Admit date: 12/23/2019 Discharge date:   Admission Diagnoses: Bilateral wrist fractures Past Medical History:  Diagnosis Date  . Medical history non-contributory     Discharge Diagnoses:  Active Problems:   Other fractures of lower end of left radius, initial encounter for closed fracture   Surgeries: Procedure(s): OPEN REDUCTION INTERNAL FIXATION (ORIF) WRIST FRACTURE on 12/23/2019    Consultants: Treatment Team:  Dominica Severin, MD  Discharged Condition: Improved  Hospital Course: Jonathan Dominguez is an 44 y.o. male who was admitted 12/23/2019 with a chief complaint of  Chief Complaint  Patient presents with  . Fall  , and found to have a diagnosis of Bilateral wrist fractures.  They were brought to the operating room on 12/23/2019 and underwent Procedure(s): OPEN REDUCTION INTERNAL FIXATION (ORIF) WRIST FRACTURE.    They were given perioperative antibiotics:  Anti-infectives (From admission, onward)   Start     Dose/Rate Route Frequency Ordered Stop   12/24/19 0600  ceFAZolin (ANCEF) IVPB 2g/100 mL premix  Status:  Discontinued        2 g 200 mL/hr over 30 Minutes Intravenous On call to O.R. 12/23/19 1815 12/23/19 2205   12/24/19 0600  ceFAZolin (ANCEF) IVPB 1 g/50 mL premix        1 g 100 mL/hr over 30 Minutes Intravenous Every 8 hours 12/23/19 2208     12/23/19 2230  ceFAZolin (ANCEF) IVPB 1 g/50 mL premix        1 g 100 mL/hr over 30 Minutes Intravenous NOW 12/23/19 2208 12/23/19 2327   12/23/19 1819  ceFAZolin (ANCEF) 2-4 GM/100ML-% IVPB       Note to Pharmacy: Shireen Quan   : cabinet override      12/23/19 1819 12/24/19 6270    .  They were given sequential compression devices, early ambulation, and Other (comment) for DVT prophylaxis.  Recent vital signs:  Patient Vitals for the past 24 hrs:  BP Temp Temp src Pulse Resp SpO2  12/26/19 0436 (!) 148/70  98 F (36.7 C) Oral 68 18 95 %  12/25/19 2038 131/67 98.4 F (36.9 C) Oral 75 18 95 %  .  Recent laboratory studies: No results found.  Discharge Medications:   Allergies as of 12/26/2019   No Known Allergies     Medication List    TAKE these medications   HYDROmorphone 2 MG tablet Commonly known as: Dilaudid Take 1 tablet (2 mg total) by mouth every 4 (four) hours as needed for severe pain.   methocarbamol 500 MG tablet Commonly known as: Robaxin Take 1 tablet (500 mg total) by mouth 4 (four) times daily.   ondansetron 4 MG tablet Commonly known as: Zofran Take 1 tablet (4 mg total) by mouth daily as needed for nausea or vomiting.       Diagnostic Studies: DG Wrist Complete Left  Result Date: 12/23/2019 CLINICAL DATA:  Pain.  Fell 8 feet off ladder.  Wrist deformity. EXAM: LEFT WRIST - COMPLETE 3+ VIEW COMPARISON:  None. FINDINGS: Acute, highly comminuted and impacted fracture of the distal radial metaphysis with intra-articular extension, approximately 4 mm of dorsal displacement, and dorsal tilt. Dorsally displaced linear ossific fragment small multiple small ossific fragments along the dorsal aspect of the lunate. Surrounding soft tissue swelling. IMPRESSION: Acute, highly comminuted and impacted fracture of the distal radial metaphysis with intra-articular extension and 4 mm of dorsal displacement and dorsal tilt. Electronically Signed  By: Feliberto Harts MD   On: 12/23/2019 14:01   DG Wrist Complete Right  Result Date: 12/23/2019 CLINICAL DATA:  Larey Seat 8 feet off ladder.  Pain.  Wrist deformity. EXAM: RIGHT WRIST - COMPLETE 3+ VIEW COMPARISON:  None. FINDINGS: Acute, comminuted and impacted fracture of the distal radial metaphysis with intra-articular extension and mild dorsal and lateral displacement. Mild dorsal tilt. Linear, longitudional nondisplaced components along the more proximal radial metadiaphysis. Surrounding soft tissue swelling. IMPRESSION: Acute, comminuted  and impacted fracture of the distal radial metaphysis with intra-articular extension and mild dorsal and lateral displacement. Electronically Signed   By: Feliberto Harts MD   On: 12/23/2019 14:02   DG MINI C-ARM IMAGE ONLY  Result Date: 12/23/2019 There is no interpretation for this exam.  This order is for images obtained during a surgical procedure.  Please See "Surgeries" Tab for more information regarding the procedure.    They benefited maximally from their hospital stay and there were no complications.     Disposition: Discharge disposition: 01-Home or Self Care      Discharge Instructions    Call MD / Call 911   Complete by: As directed    If you experience chest pain or shortness of breath, CALL 911 and be transported to the hospital emergency room.  If you develope a fever above 101 F, pus (white drainage) or increased drainage or redness at the wound, or calf pain, call your surgeon's office.   Constipation Prevention   Complete by: As directed    Drink plenty of fluids.  Prune juice may be helpful.  You may use a stool softener, such as Colace (over the counter) 100 mg twice a day.  Use MiraLax (over the counter) for constipation as needed.   Diet - low sodium heart healthy   Complete by: As directed    Increase activity slowly as tolerated   Complete by: As directed       Follow-up Information    Dominica Severin, MD Follow up in 14 day(s).   Specialty: Orthopedic Surgery Why: Please call to see Dr. Amanda Pea in 14 days Contact information: [address] [place] [address] [phone]              Status post open reduction internal fixation left and right comminuted complex distal radius fractures.  Patient also has a left shoulder contusion we will await x-rays.  We will plan for tentative discharge today on oxycodone, Robaxin, Zofran,.  I discussed all issues with the patient and his extended family.  We discussed relevant issues  days and thus.  At the time of discharge he is awake alert oriented with stable vital signs and no complications.  I discussed these issues with him at length and the findings. Signed: Dionne Ano Soul Hackman III 12/26/2019, 7:41 AM

## 2019-12-26 NOTE — Progress Notes (Signed)
Discharge package printed. Instructions given to patient. Verbalizes understanding.

## 2019-12-26 NOTE — Progress Notes (Signed)
Occupational Therapy Treatment Patient Details Name: Jonathan Dominguez MRN: [id number] DOB: 12-27-1975 Today's Date: 12/26/2019    History of present illness 44 y.o male presenting after a fall from a ladder. Pt is now s/p bil ORIF of distal radius fxs. No significant PMH on file.   OT comments  Pt seen for follow up session in preparation for d/c. Session focused on AE education review and manual therapy to bil digits. Pt having increased difficulty managing self feeding this date due to pain, requiring mod A to manipulate small pieces of bagel. Pt plans to go home with daughter, ex wife and ex wife's husband for support. Performed manual therapy to bil digits- pt reports extreme decrease in pain and stiffness. D/c recs remain appropriate, will continue to follow while acute.    Follow Up Recommendations  Outpatient OT;Supervision - Intermittent    Equipment Recommendations  None recommended by OT    Recommendations for Other Services      Precautions / Restrictions Precautions Precautions: Other (comment) Precaution Comments: bil wrists immobilized Required Braces or Orthoses: Splint/Cast Splint/Cast: bil wrists Restrictions Weight Bearing Restrictions: Yes RUE Weight Bearing: Non weight bearing LUE Weight Bearing: Non weight bearing       Mobility Bed Mobility                  Transfers                      Balance Overall balance assessment: No apparent balance deficits (not formally assessed)                                         ADL either performed or assessed with clinical judgement   ADL Overall ADL's : Needs assistance/impaired Eating/Feeding: Moderate assistance Eating/Feeding Details (indicate cue type and reason): pt having increased pain in digits for self feeding this date, required mod A to feed self bagel.                                 Functional mobility during ADLs: Modified independent General ADL  Comments: reviewed adaptive strategies for home, pt is in understanding. Note pt is having increased difficulty with BADL due to pain     Vision Patient Visual Report: No change from baseline     Perception     Praxis      Cognition Arousal/Alertness: Awake/alert Behavior During Therapy: WFL for tasks assessed/performed Overall Cognitive Status: Within Functional Limits for tasks assessed                                          Exercises Other Exercises Other Exercises: Edema massage to all digits Other Exercises: Circumduction to all digits PROM (counter and clockwise) x10 per digit Other Exercises: Thumb flexion and extension and circumduction x10 bilaterally   Shoulder Instructions       General Comments      Pertinent Vitals/ Pain       Pain Assessment: 0-10 Pain Score: 8  Pain Location: L >RUE Pain Descriptors / Indicators: Aching;Sore Pain Intervention(s): Limited activity within patient's tolerance;Monitored during session;Repositioned  Home Living  Prior Functioning/Environment              Frequency  Min 2X/week        Progress Toward Goals  OT Goals(current goals can now be found in the care plan section)  Progress towards OT goals: Progressing toward goals  Acute Rehab OT Goals Patient Stated Goal: return to independence OT Goal Formulation: With patient Time For Goal Achievement: 01/07/20 Potential to Achieve Goals: Good  Plan Discharge plan remains appropriate    Co-evaluation                 AM-PAC OT "6 Clicks" Daily Activity     Outcome Measure   Help from another person eating meals?: A Lot Help from another person taking care of personal grooming?: A Little Help from another person toileting, which includes using toliet, bedpan, or urinal?: A Little Help from another person bathing (including washing, rinsing, drying)?: A Little Help from  another person to put on and taking off regular upper body clothing?: A Little Help from another person to put on and taking off regular lower body clothing?: A Little 6 Click Score: 17    End of Session    OT Visit Diagnosis: Other abnormalities of gait and mobility (R26.89);Pain Pain - Right/Left:  (bil) Pain - part of body:  (hands)   Activity Tolerance Patient tolerated treatment well   Patient Left in bed;with call bell/phone within reach   Nurse Communication Mobility status;Precautions;Weight bearing status        Time: 1016-1050 OT Time Calculation (min): 34 min  Charges: OT General Charges $OT Visit: 1 Visit OT Treatments $Therapeutic Activity: 23-37 mins  Dalphine Handing, MSOT, OTR/L Acute Rehabilitation Services [facility] Office Number: [phone] Pager: [phone]  Dalphine Handing 12/26/2019, 1:10 PM

## 2020-01-01 ENCOUNTER — Encounter (HOSPITAL_COMMUNITY): Payer: Self-pay | Admitting: Orthopedic Surgery

## 2022-03-14 IMAGING — CR DG WRIST COMPLETE 3+V*L*
4 series · 4 of 4 positions shown · non-contrast
Comparison: None.

CLINICAL DATA: Pain.  Fell 8 feet off ladder.  Wrist deformity.

EXAM:
LEFT WRIST - COMPLETE 3+ VIEW

[wrist pa]
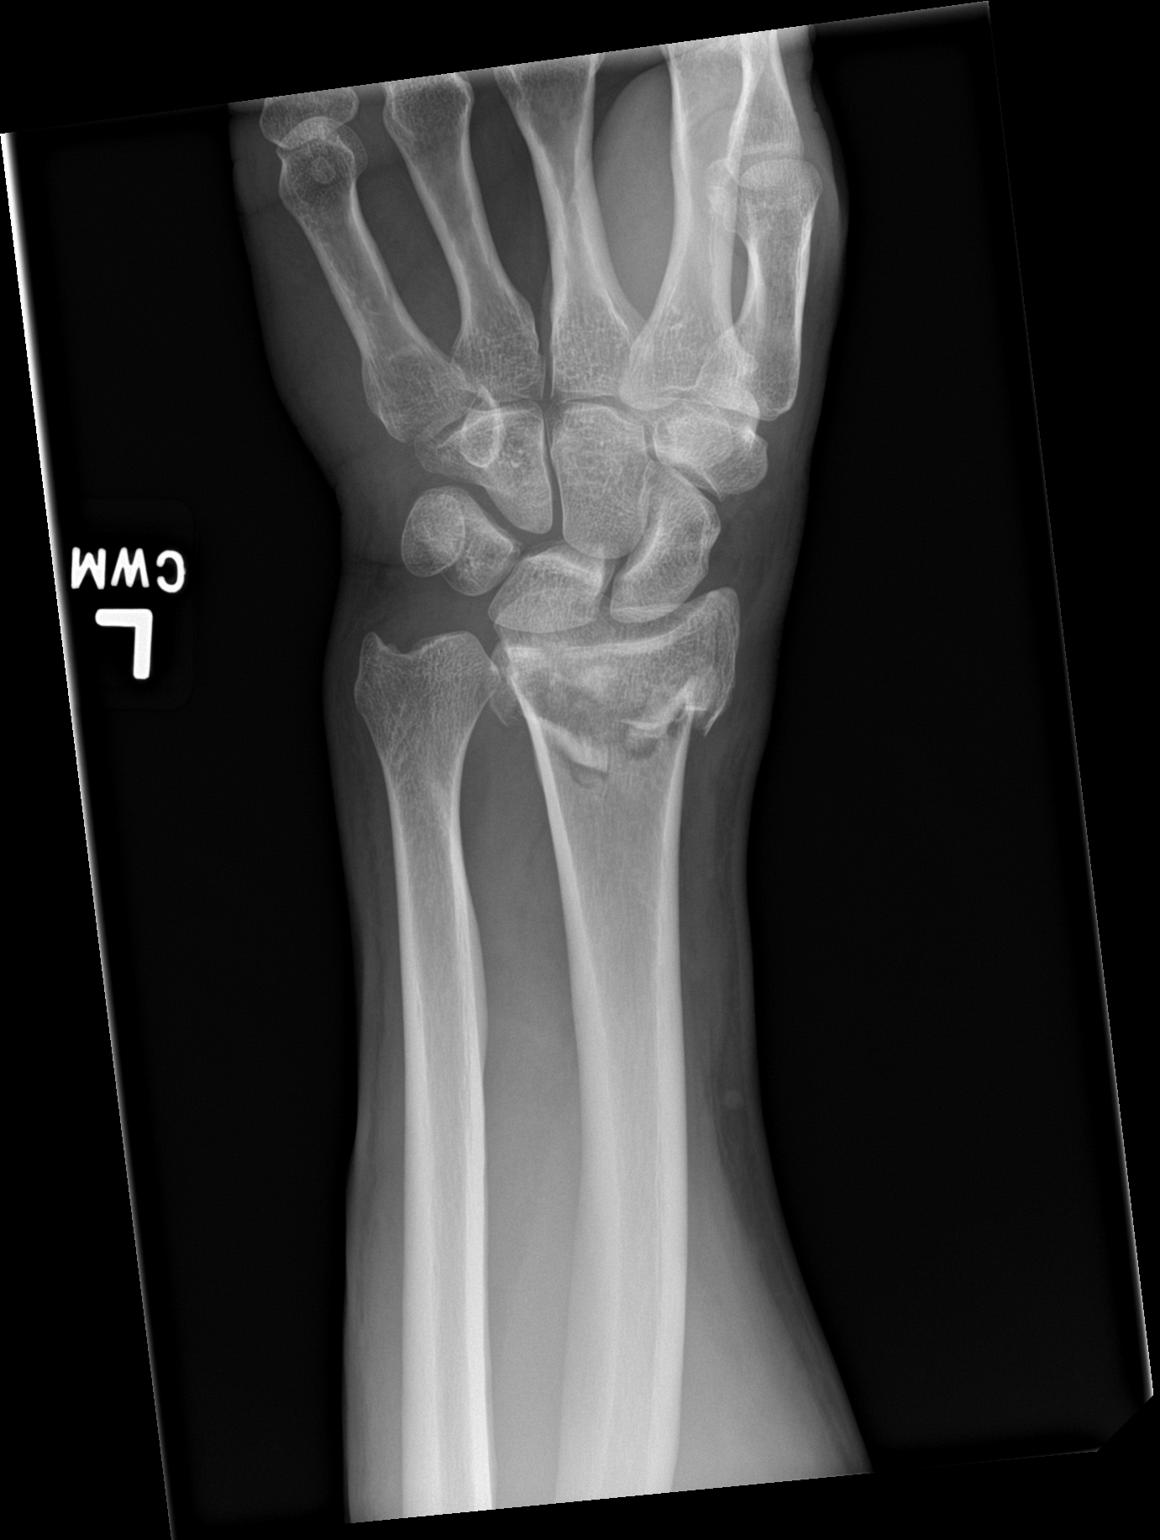

[wrist obl]
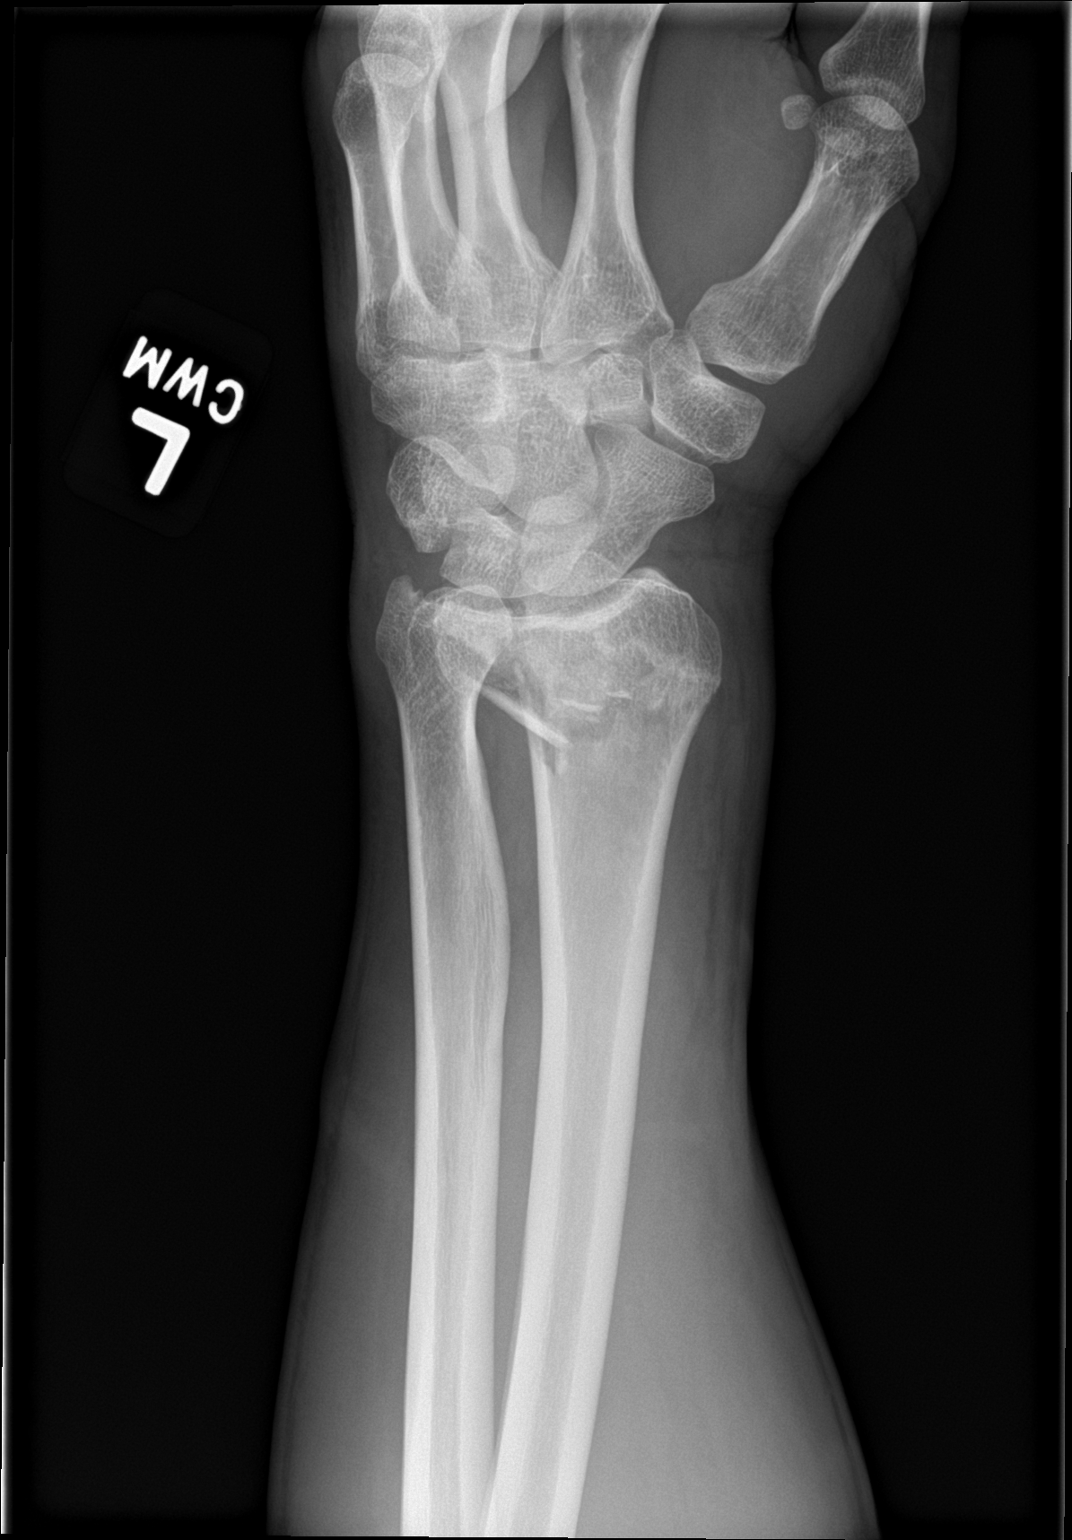

[wrist lat]
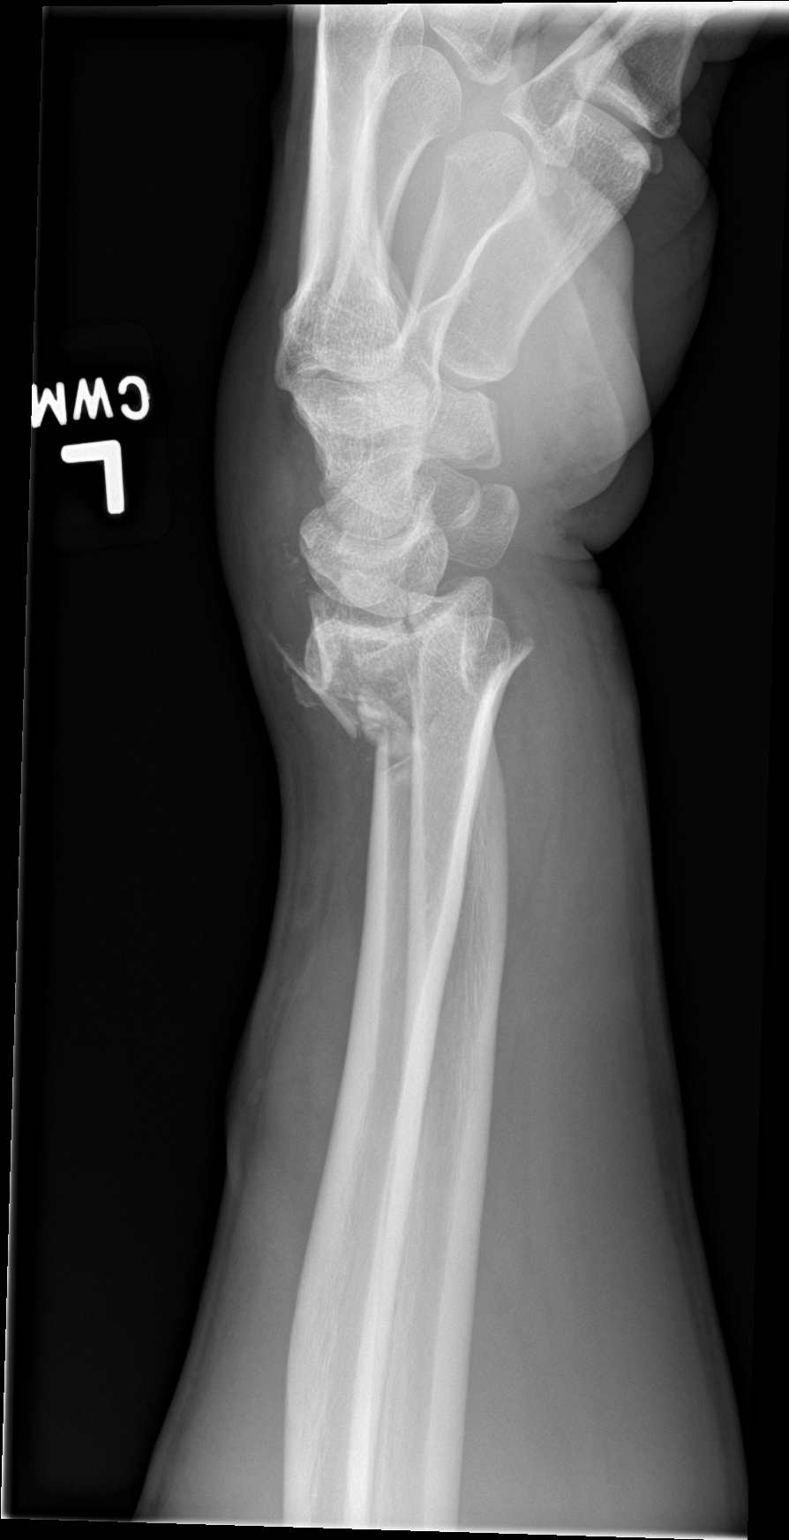

[wrist navicular]
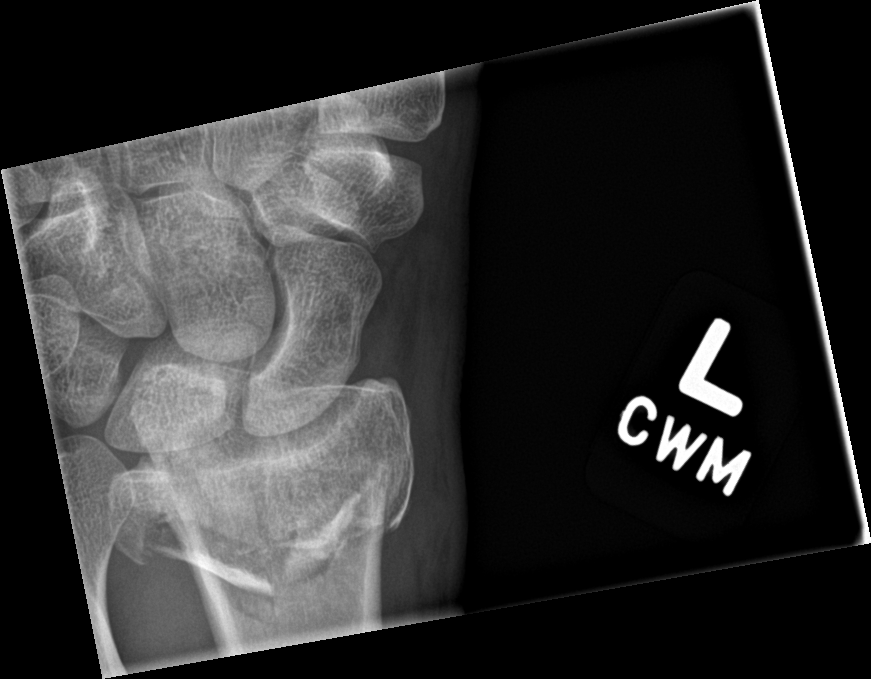

[4 of 4 positions shown; findings below may reference images not displayed]

FINDINGS: Acute, highly comminuted and impacted fracture of the distal radial
metaphysis with intra-articular extension, approximately 4 mm of
dorsal displacement, and dorsal tilt. Dorsally displaced linear
ossific fragment small multiple small ossific fragments along the
dorsal aspect of the lunate. Surrounding soft tissue swelling.
IMPRESSION: Acute, highly comminuted and impacted fracture of the distal radial
metaphysis with intra-articular extension and 4 mm of dorsal
displacement and dorsal tilt.

## 2024-02-10 ENCOUNTER — Other Ambulatory Visit: Payer: Self-pay

## 2024-02-10 ENCOUNTER — Encounter (HOSPITAL_COMMUNITY): Payer: Self-pay

## 2024-02-10 ENCOUNTER — Emergency Department (HOSPITAL_COMMUNITY)
Admission: EM | Admit: 2024-02-10 | Discharge: 2024-02-11 | Disposition: A | Payer: Self-pay | Attending: Emergency Medicine | Admitting: Emergency Medicine

## 2024-02-10 DIAGNOSIS — N23 Unspecified renal colic: Secondary | ICD-10-CM | POA: Insufficient documentation

## 2024-02-10 DIAGNOSIS — R7989 Other specified abnormal findings of blood chemistry: Secondary | ICD-10-CM | POA: Insufficient documentation

## 2024-02-10 DIAGNOSIS — R748 Abnormal levels of other serum enzymes: Secondary | ICD-10-CM | POA: Insufficient documentation

## 2024-02-10 MED ORDER — HYDROMORPHONE HCL 1 MG/ML IJ SOLN
1.0000 mg | Freq: Once | INTRAMUSCULAR | Status: AC
  Administered 2024-02-10: 1 mg via INTRAVENOUS
  Filled 2024-02-10: qty 1

## 2024-02-10 NOTE — ED Notes (Signed)
 Placed on 2L O2 comfort care

## 2024-02-10 NOTE — ED Provider Notes (Signed)
  EMERGENCY DEPARTMENT AT [facility] Provider Note   CSN: [id number] Arrival date & time: 02/10/24  2235     Patient presents with: Flank Pain   Jonathan Dominguez is a 48 y.o. male.  {Add pertinent medical, surgical, social history, OB history to YEP:67052} The history is provided by the patient.  Flank Pain  [name] is a 48 y.o. male who presents to the Emergency Department complaining of *** Bad pain in back and stomach Feels like a kidney stone. Hard to urinate Vomited tonight.  5p. Has had milder pain for two weeks, severe tonight.  Right side.  No fever but got hot and cold.  Dribbling urine. Has pressure.  No medical problems. No meds.    Taking online stone breaker for a week.   Prior to Admission medications   Medication Sig Start Date End Date Taking? Authorizing Provider  HYDROmorphone  (DILAUDID ) 2 MG tablet Take 1 tablet (2 mg total) by mouth every 4 (four) hours as needed for severe pain. 12/26/19   Camella Fallow, MD  methocarbamol  (ROBAXIN ) 500 MG tablet Take 1 tablet (500 mg total) by mouth 4 (four) times daily. 12/26/19   Camella Fallow, MD    Allergies: Patient has no known allergies.    Review of Systems  Genitourinary:  Positive for flank pain.  All other systems reviewed and are negative.   Updated Vital Signs BP 139/83   Pulse 72   Temp 97.9 F (36.6 C) (Oral)   Resp 18   Ht 5' 8 (1.727 m)   Wt 88.5 kg   SpO2 100%   BMI 29.65 kg/m   Physical Exam Vitals and nursing note reviewed.  Constitutional:      Appearance: He is well-developed.     Comments: Appears uncomfortable  HENT:     Head: Normocephalic and atraumatic.  Cardiovascular:     Rate and Rhythm: Normal rate and regular rhythm.     Heart sounds: No murmur heard. Pulmonary:     Effort: Pulmonary effort is normal. No respiratory distress.     Breath sounds: Normal breath sounds.  Abdominal:     Palpations: Abdomen is soft.     Tenderness:  There is no abdominal tenderness. There is no guarding or rebound.  Musculoskeletal:        General: No tenderness.  Skin:    General: Skin is warm and dry.  Neurological:     Mental Status: He is alert and oriented to person, place, and time.  Psychiatric:        Behavior: Behavior normal.     (all labs ordered are listed, but only abnormal results are displayed) Labs Reviewed - No data to display  EKG: None  Radiology: No results found.  {Document cardiac monitor, telemetry assessment procedure when appropriate:32947} Procedures   Medications Ordered in the ED - No data to display    {Click here for ABCD2, HEART and other calculators REFRESH Note before signing:1}                              Medical Decision Making  ***  {Document critical care time when appropriate  Document review of labs and clinical decision tools ie CHADS2VASC2, etc  Document your independent review of radiology images and any outside records  Document your discussion with family members, caretakers and with consultants  Document social determinants of health affecting pt's care  Document your decision making why  or why not admission, treatments were needed:32947:::1}   Final diagnoses:  None    ED Discharge Orders     None

## 2024-02-10 NOTE — ED Triage Notes (Signed)
 BIBA from Home, right sided flank/back pain radiating to the RLQ, 10/10. hasn't urinated since this afternoon. Hx of kidney stones, last stone about 6 years ago, no shob.  Given 4x 50mcg Fentinyl by ems.  Last dose at 2228, 4mg  zofran , 500ml NS

## 2024-02-11 ENCOUNTER — Emergency Department (HOSPITAL_COMMUNITY): Payer: Self-pay

## 2024-02-11 LAB — CBC WITH DIFFERENTIAL/PLATELET
Abs Immature Granulocytes: 0.03 K/uL (ref 0.00–0.07)
Basophils Absolute: 0.1 K/uL (ref 0.0–0.1)
Basophils Relative: 1 %
Eosinophils Absolute: 0.1 K/uL (ref 0.0–0.5)
Eosinophils Relative: 2 %
HCT: 40.6 % (ref 39.0–52.0)
Hemoglobin: 13.6 g/dL (ref 13.0–17.0)
Immature Granulocytes: 0 %
Lymphocytes Relative: 17 %
Lymphs Abs: 1.2 K/uL (ref 0.7–4.0)
MCH: 29.9 pg (ref 26.0–34.0)
MCHC: 33.5 g/dL (ref 30.0–36.0)
MCV: 89.2 fL (ref 80.0–100.0)
Monocytes Absolute: 0.3 K/uL (ref 0.1–1.0)
Monocytes Relative: 5 %
Neutro Abs: 5.1 K/uL (ref 1.7–7.7)
Neutrophils Relative %: 75 %
Platelets: 169 K/uL (ref 150–400)
RBC: 4.55 MIL/uL (ref 4.22–5.81)
RDW: 12.9 % (ref 11.5–15.5)
WBC: 6.8 K/uL (ref 4.0–10.5)
nRBC: 0 % (ref 0.0–0.2)

## 2024-02-11 LAB — COMPREHENSIVE METABOLIC PANEL WITH GFR
ALT: 64 U/L — ABNORMAL HIGH (ref 0–44)
AST: 51 U/L — ABNORMAL HIGH (ref 15–41)
Albumin: 4.3 g/dL (ref 3.5–5.0)
Alkaline Phosphatase: 58 U/L (ref 38–126)
Anion gap: 15 (ref 5–15)
BUN: 21 mg/dL — ABNORMAL HIGH (ref 6–20)
CO2: 21 mmol/L — ABNORMAL LOW (ref 22–32)
Calcium: 9.4 mg/dL (ref 8.9–10.3)
Chloride: 105 mmol/L (ref 98–111)
Creatinine, Ser: 1.29 mg/dL — ABNORMAL HIGH (ref 0.61–1.24)
GFR, Estimated: >60 mL/min (ref >60)
Glucose, Bld: 117 mg/dL — ABNORMAL HIGH (ref 70–99)
Potassium: 3.6 mmol/L (ref 3.5–5.1)
Sodium: 141 mmol/L (ref 135–145)
Total Bilirubin: 0.6 mg/dL (ref 0.0–1.2)
Total Protein: 6.7 g/dL (ref 6.5–8.1)

## 2024-02-11 LAB — URINALYSIS, W/ REFLEX TO CULTURE (INFECTION SUSPECTED)
Bacteria, UA: NONE SEEN
Bilirubin Urine: NEGATIVE
Glucose, UA: NEGATIVE mg/dL
Hgb urine dipstick: NEGATIVE
Ketones, ur: 20 mg/dL — AB
Leukocytes,Ua: NEGATIVE
Nitrite: NEGATIVE
Protein, ur: NEGATIVE mg/dL
Specific Gravity, Urine: 1.018 (ref 1.005–1.030)
pH: 7 (ref 5.0–8.0)

## 2024-02-11 MED ORDER — SODIUM CHLORIDE 0.9 % IV BOLUS
1000.0000 mL | Freq: Once | INTRAVENOUS | Status: AC
  Administered 2024-02-11: 1000 mL via INTRAVENOUS

## 2024-02-11 MED ORDER — TAMSULOSIN HCL 0.4 MG PO CAPS: 0.4000 mg | ORAL_CAPSULE | Freq: Every day | ORAL | 0 refills | Status: AC

## 2024-02-11 MED ORDER — KETOROLAC TROMETHAMINE 15 MG/ML IJ SOLN
15.0000 mg | Freq: Once | INTRAMUSCULAR | Status: AC
  Administered 2024-02-11: 15 mg via INTRAVENOUS
  Filled 2024-02-11: qty 1

## 2024-02-11 MED ORDER — OXYCODONE-ACETAMINOPHEN 5-325 MG PO TABS: 1.0000 | ORAL_TABLET | Freq: Four times a day (QID) | ORAL | 0 refills | Status: AC | PRN

## 2024-02-11 NOTE — Discharge Instructions (Addendum)
 You have a 3 mm stone in your right ureter. Drink plenty of fluids. Get rechecked if you have fevers, uncontrolled pain or cannot urinate.
